# Patient Record
Sex: Female | Born: 1945 | Race: White | Hispanic: No | Marital: Married | Smoking: Former smoker
Health system: Southern US, Community
[De-identification: ages and names within clinical notes are randomized; demographics above are authoritative.]

## PROBLEM LIST (undated history)

## (undated) DIAGNOSIS — D649 Anemia, unspecified: Secondary | ICD-10-CM

## (undated) DIAGNOSIS — E039 Hypothyroidism, unspecified: Secondary | ICD-10-CM

## (undated) DIAGNOSIS — J45909 Unspecified asthma, uncomplicated: Secondary | ICD-10-CM

## (undated) DIAGNOSIS — M1990 Unspecified osteoarthritis, unspecified site: Secondary | ICD-10-CM

## (undated) DIAGNOSIS — I10 Essential (primary) hypertension: Secondary | ICD-10-CM

## (undated) DIAGNOSIS — N183 Chronic kidney disease, stage 3 unspecified: Secondary | ICD-10-CM

## (undated) DIAGNOSIS — E079 Disorder of thyroid, unspecified: Secondary | ICD-10-CM

## (undated) DIAGNOSIS — F419 Anxiety disorder, unspecified: Secondary | ICD-10-CM

## (undated) HISTORY — PX: CHOLECYSTECTOMY: SHX55

## (undated) HISTORY — DX: Unspecified osteoarthritis, unspecified site: M19.90

## (undated) HISTORY — DX: Chronic kidney disease, stage 3 unspecified: N18.30

## (undated) HISTORY — PX: CATARACT EXTRACTION, BILATERAL: SHX1313

## (undated) HISTORY — PX: ABDOMINAL HYSTERECTOMY: SHX81

---

## 2012-03-16 DIAGNOSIS — R109 Unspecified abdominal pain: Secondary | ICD-10-CM

## 2013-11-06 ENCOUNTER — Emergency Department (HOSPITAL_COMMUNITY): Payer: Medicare Other

## 2013-11-06 ENCOUNTER — Encounter (HOSPITAL_COMMUNITY): Payer: Self-pay | Admitting: Emergency Medicine

## 2013-11-06 ENCOUNTER — Emergency Department (HOSPITAL_COMMUNITY)
Admission: EM | Admit: 2013-11-06 | Discharge: 2013-11-06 | Disposition: A | Discharge status: Home / Self Care | Payer: Medicare Other | Attending: Emergency Medicine | Admitting: Emergency Medicine

## 2013-11-06 DIAGNOSIS — Z87891 Personal history of nicotine dependence: Secondary | ICD-10-CM | POA: Insufficient documentation

## 2013-11-06 DIAGNOSIS — J45909 Unspecified asthma, uncomplicated: Secondary | ICD-10-CM | POA: Insufficient documentation

## 2013-11-06 DIAGNOSIS — Z8639 Personal history of other endocrine, nutritional and metabolic disease: Secondary | ICD-10-CM | POA: Insufficient documentation

## 2013-11-06 DIAGNOSIS — Z880 Allergy status to penicillin: Secondary | ICD-10-CM | POA: Insufficient documentation

## 2013-11-06 DIAGNOSIS — Z862 Personal history of diseases of the blood and blood-forming organs and certain disorders involving the immune mechanism: Secondary | ICD-10-CM | POA: Insufficient documentation

## 2013-11-06 DIAGNOSIS — M7918 Myalgia, other site: Secondary | ICD-10-CM

## 2013-11-06 DIAGNOSIS — R109 Unspecified abdominal pain: Secondary | ICD-10-CM | POA: Insufficient documentation

## 2013-11-06 DIAGNOSIS — R0789 Other chest pain: Secondary | ICD-10-CM | POA: Insufficient documentation

## 2013-11-06 HISTORY — DX: Unspecified asthma, uncomplicated: J45.909

## 2013-11-06 HISTORY — DX: Essential (primary) hypertension: I10

## 2013-11-06 HISTORY — DX: Disorder of thyroid, unspecified: E07.9

## 2013-11-06 LAB — CBC WITH DIFFERENTIAL/PLATELET
BASOS ABS: 0 10*3/uL (ref 0.0–0.1)
Basophils Relative: 0 % (ref 0–1)
EOS PCT: 4 % (ref 0–5)
Eosinophils Absolute: 0.3 10*3/uL (ref 0.0–0.7)
HCT: 30.4 % — ABNORMAL LOW (ref 36.0–46.0)
HEMOGLOBIN: 10.1 g/dL — AB (ref 12.0–15.0)
Lymphocytes Relative: 21 % (ref 12–46)
Lymphs Abs: 1.6 10*3/uL (ref 0.7–4.0)
MCH: 30.7 pg (ref 26.0–34.0)
MCHC: 33.2 g/dL (ref 30.0–36.0)
MCV: 92.4 fL (ref 78.0–100.0)
MONOS PCT: 6 % (ref 3–12)
Monocytes Absolute: 0.5 10*3/uL (ref 0.1–1.0)
NEUTROS ABS: 5.4 10*3/uL (ref 1.7–7.7)
Neutrophils Relative %: 69 % (ref 43–77)
Platelets: 352 10*3/uL (ref 150–400)
RBC: 3.29 MIL/uL — ABNORMAL LOW (ref 3.87–5.11)
RDW: 13.1 % (ref 11.5–15.5)
WBC: 7.8 10*3/uL (ref 4.0–10.5)

## 2013-11-06 LAB — URINALYSIS, ROUTINE W REFLEX MICROSCOPIC
BILIRUBIN URINE: NEGATIVE
GLUCOSE, UA: NEGATIVE mg/dL
Hgb urine dipstick: NEGATIVE
Ketones, ur: NEGATIVE mg/dL
LEUKOCYTES UA: NEGATIVE
Nitrite: NEGATIVE
PH: 5.5 (ref 5.0–8.0)
Protein, ur: NEGATIVE mg/dL
Specific Gravity, Urine: 1.01 (ref 1.005–1.030)
Urobilinogen, UA: 0.2 mg/dL (ref 0.0–1.0)

## 2013-11-06 LAB — COMPREHENSIVE METABOLIC PANEL
ALT: 14 U/L (ref 0–35)
AST: 18 U/L (ref 0–37)
Albumin: 3.5 g/dL (ref 3.5–5.2)
Alkaline Phosphatase: 82 U/L (ref 39–117)
BUN: 12 mg/dL (ref 6–23)
CALCIUM: 9.5 mg/dL (ref 8.4–10.5)
CHLORIDE: 99 meq/L (ref 96–112)
CO2: 31 meq/L (ref 19–32)
CREATININE: 1.04 mg/dL (ref 0.50–1.10)
GFR calc Af Amer: 63 mL/min — ABNORMAL LOW (ref >90)
GFR, EST NON AFRICAN AMERICAN: 55 mL/min — AB (ref >90)
Glucose, Bld: 96 mg/dL (ref 70–99)
Potassium: 4.2 mEq/L (ref 3.7–5.3)
Sodium: 140 mEq/L (ref 137–147)
Total Protein: 7.5 g/dL (ref 6.0–8.3)

## 2013-11-06 LAB — TROPONIN I

## 2013-11-06 LAB — LIPASE, BLOOD: Lipase: 42 U/L (ref 11–59)

## 2013-11-06 MED ORDER — FENTANYL CITRATE 0.05 MG/ML IJ SOLN: 50.0000 ug | INTRAMUSCULAR | Status: DC | PRN

## 2013-11-06 MED ORDER — SODIUM CHLORIDE 0.9 % IV SOLN: INTRAVENOUS | Status: DC

## 2013-11-06 MED ORDER — HYDROCODONE-ACETAMINOPHEN 5-325 MG PO TABS: ORAL_TABLET | ORAL | Status: DC

## 2013-11-06 MED ORDER — METHOCARBAMOL 500 MG PO TABS: 500.0000 mg | ORAL_TABLET | Freq: Two times a day (BID) | ORAL | Status: DC

## 2013-11-06 MED ORDER — IOHEXOL 350 MG/ML SOLN
100.0000 mL | Freq: Once | INTRAVENOUS | Status: AC | PRN
  Administered 2013-11-06: 100 mL via INTRAVENOUS

## 2013-11-06 NOTE — ED Notes (Signed)
Pt c/o left side flank pain that radiates to LLQ. Pain began last night. Pt denies dysuria, hematuria. Denies n/v/d.

## 2013-11-06 NOTE — ED Notes (Signed)
Patient transported to X-ray 

## 2013-11-06 NOTE — ED Provider Notes (Signed)
CSN: [id number]     Arrival date & time 11/06/13  0807 History   First MD Initiated Contact with Patient 11/06/13 0831     Chief Complaint  Patient presents with  . Flank Pain      HPI Pt was seen at 0850. Per pt, c/o gradual onset and persistence of constant left lower lateral chest and left flank "pain" that began last night. Pt states the pain wraps around to her lower anterior ribs and upper abd. Describes the pain as "sharp" and "dull," worsens with palpation of the area and movement of her torso. Endorses she had N/V 5 days ago, followed by 2 days of diarrhea. States she took an imodium and saw her PMD 2 days ago for same. States all those symptoms have improved. Denies palpitations, no SOB/cough, no further N/V/D, no fevers, no rash, no dysuria/hematuria, no black or blood in stools or emesis.     Past Medical History  Diagnosis Date  . Asthma   . Thyroid disease    Past Surgical History  Procedure Laterality Date  . Abdominal hysterectomy    . Cholecystectomy      History  Substance Use Topics  . Smoking status: Former Research scientist (life sciences)  . Smokeless tobacco: Not on file  . Alcohol Use: No    Review of Systems ROS: Statement: All systems negative except as marked or noted in the HPI; Constitutional: Negative for fever and chills. ; ; Eyes: Negative for eye pain, redness and discharge. ; ; ENMT: Negative for ear pain, hoarseness, nasal congestion, sinus pressure and sore throat. ; ; Cardiovascular: +lower lateral chest wall pain. Negative for palpitations, diaphoresis, dyspnea and peripheral edema. ; ; Respiratory: Negative for cough, wheezing and stridor. ; ; Gastrointestinal: Negative for nausea, vomiting, diarrhea, abdominal pain, blood in stool, hematemesis, jaundice and rectal bleeding. . ; ; Genitourinary: +left flank pain. Negative for dysuria and hematuria. ; ; Musculoskeletal: Negative for back pain and neck pain. Negative for swelling and trauma.; ; Skin: Negative for pruritus,  rash, abrasions, blisters, bruising and skin lesion.; ; Neuro: Negative for headache, lightheadedness and neck stiffness. Negative for weakness, altered level of consciousness , altered mental status, extremity weakness, paresthesias, involuntary movement, seizure and syncope.      Allergies  Penicillins  Home Medications  No current outpatient prescriptions on file. BP 127/79  Pulse 68  Temp(Src) 97.9 F (36.6 C) (Oral)  Resp 16  SpO2 95% Physical Exam 0855; Physical examination:  Nursing notes reviewed; Vital signs and O2 SAT reviewed;  Constitutional: Well developed, Well nourished, Well hydrated, In no acute distress; Head:  Normocephalic, atraumatic; Eyes: EOMI, PERRL, No scleral icterus; ENMT: Mouth and pharynx normal, Mucous membranes moist; Neck: Supple, Full range of motion, No lymphadenopathy; Cardiovascular: Regular rate and rhythm, No murmur, rub, or gallop; Respiratory: Breath sounds clear & equal bilaterally, No wheezes.  Speaking full sentences with ease, Normal respiratory effort/excursion; Chest: +left lower lateral and posterior chest wall tenderness to palp, no rash, no deformity, no soft tissue crepitus. Movement normal; Abdomen: Soft, Nontender, Nondistended, Normal bowel sounds; Genitourinary: No CVA tenderness; Spine:  No midline CS, TS, LS tenderness. +TTP left lumbar paraspinal muscles. No rash.;; Extremities: Pulses normal, No tenderness, No edema, No calf edema or asymmetry.; Neuro: AA&Ox3, Major CN grossly intact.  Speech clear. No gross focal motor or sensory deficits in extremities.; Skin: Color normal, Warm, Dry.   ED Course  Procedures     EKG Interpretation   Date/Time:  Friday November 06 2013 09:26:57 EDT Ventricular Rate:  64 PR Interval:  154 QRS Duration: 66 QT Interval:  394 QTC Calculation: 406 R Axis:   16 Text Interpretation:  Normal sinus rhythm Low voltage QRS Cannot rule out  Anterior infarct , age undetermined Abnormal ECG No previous ECGs   available Confirmed by [facility]  MD, Nunzio Cory [phone]) on 11/06/2013  10:07:52 AM      MDM  MDM Reviewed: nursing note and vitals Interpretation: labs, ECG, x-ray and CT scan    Results for orders placed during the hospital encounter of 11/06/13  URINALYSIS, ROUTINE W REFLEX MICROSCOPIC      Result Value Ref Range   Color, Urine YELLOW  YELLOW   APPearance CLEAR  CLEAR   Specific Gravity, Urine 1.010  1.005 - 1.030   pH 5.5  5.0 - 8.0   Glucose, UA NEGATIVE  NEGATIVE mg/dL   Hgb urine dipstick NEGATIVE  NEGATIVE   Bilirubin Urine NEGATIVE  NEGATIVE   Ketones, ur NEGATIVE  NEGATIVE mg/dL   Protein, ur NEGATIVE  NEGATIVE mg/dL   Urobilinogen, UA 0.2  0.0 - 1.0 mg/dL   Nitrite NEGATIVE  NEGATIVE   Leukocytes, UA NEGATIVE  NEGATIVE  CBC WITH DIFFERENTIAL      Result Value Ref Range   WBC 7.8  4.0 - 10.5 K/uL   RBC 3.29 (*) 3.87 - 5.11 MIL/uL   Hemoglobin 10.1 (*) 12.0 - 15.0 g/dL   HCT 30.4 (*) 36.0 - 46.0 %   MCV 92.4  78.0 - 100.0 fL   MCH 30.7  26.0 - 34.0 pg   MCHC 33.2  30.0 - 36.0 g/dL   RDW 13.1  11.5 - 15.5 %   Platelets 352  150 - 400 K/uL   Neutrophils Relative % 69  43 - 77 %   Neutro Abs 5.4  1.7 - 7.7 K/uL   Lymphocytes Relative 21  12 - 46 %   Lymphs Abs 1.6  0.7 - 4.0 K/uL   Monocytes Relative 6  3 - 12 %   Monocytes Absolute 0.5  0.1 - 1.0 K/uL   Eosinophils Relative 4  0 - 5 %   Eosinophils Absolute 0.3  0.0 - 0.7 K/uL   Basophils Relative 0  0 - 1 %   Basophils Absolute 0.0  0.0 - 0.1 K/uL  COMPREHENSIVE METABOLIC PANEL      Result Value Ref Range   Sodium 140  137 - 147 mEq/L   Potassium 4.2  3.7 - 5.3 mEq/L   Chloride 99  96 - 112 mEq/L   CO2 31  19 - 32 mEq/L   Glucose, Bld 96  70 - 99 mg/dL   BUN 12  6 - 23 mg/dL   Creatinine, Ser 1.04  0.50 - 1.10 mg/dL   Calcium 9.5  8.4 - 10.5 mg/dL   Total Protein 7.5  6.0 - 8.3 g/dL   Albumin 3.5  3.5 - 5.2 g/dL   AST 18  0 - 37 U/L   ALT 14  0 - 35 U/L   Alkaline Phosphatase 82  39 - 117 U/L    Total Bilirubin <0.2 (*) 0.3 - 1.2 mg/dL   GFR calc non Af Amer 55 (*) >90 mL/min   GFR calc Af Amer 63 (*) >90 mL/min  LIPASE, BLOOD      Result Value Ref Range   Lipase 42  11 - 59 U/L  TROPONIN I      Result Value Ref Range  Troponin I <0.30  <0.30 ng/mL   Dg Ribs Unilateral W/chest Left 11/06/2013   CLINICAL DATA:  Left flank pain  EXAM: LEFT RIBS AND CHEST - 3+ VIEW  COMPARISON:  12/05/2008  FINDINGS: No fracture or other bone lesions are seen involving the ribs. There is no evidence of pneumothorax or pleural effusion. Both lungs are clear. Heart size and mediastinal contours are within normal limits.  IMPRESSION: Negative.   Electronically Signed   By: Franchot Gallo M.D.   On: 11/06/2013 09:49   Ct Angio Chest Pe W/cm &/or Wo Cm 11/06/2013   CLINICAL DATA:  Epigastric pain, left lower chest pain, upper abdominal on left flank pain with pain radiating to left lower quadrant for 1 day, history hypertension, asthma, thyroid disease  EXAM: CT ANGIOGRAPHY CHEST  CT ABDOMEN AND PELVIS WITH CONTRAST  TECHNIQUE: Multidetector CT imaging of the chest was performed using the standard protocol during bolus administration of intravenous contrast. Multiplanar CT image reconstructions and MIPs were obtained to evaluate the vascular anatomy. Multidetector CT imaging of the abdomen and pelvis was performed using the standard protocol during bolus administration of intravenous contrast. Sagittal and coronal MPR images reconstructed from abdominal/pelvic axial data set.  CONTRAST:  146mL OMNIPAQUE IOHEXOL 350 MG/ML SOLN IV. Dilute oral contrast.  COMPARISON:  None  FINDINGS: CTA CHEST FINDINGS  Mild atherosclerotic calcification aorta and coronary arteries.  Aorta normal caliber without aneurysm or dissection.  Pulmonary arteries well opacified and patent.  No evidence of pulmonary embolism.  No thoracic adenopathy.  Scattered atelectasis in both lungs.  Tiny nonspecific right middle lobe nodules.  Additional  tiny nodule at the right major fissure image 53 calcified granuloma lingula image 49.  7 x 5 mm diameter left lower lobe nodule left lower lobe image 50.  No infiltrate, pleural effusion or pneumothorax.  No acute osseous findings.  CT ABDOMEN and PELVIS FINDINGS  Gallbladder and uterus surgically absent.  Liver, spleen, pancreas, kidneys, and adrenal glands normal appearance.  Distal descending and sigmoid colonic diverticulosis without evidence of diverticulitis.  Unremarkable bladder, ureters, and ovaries.  Appendix not visualized.  No mass, adenopathy, free fluid or inflammatory process.  No hernia or acute osseous findings.  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  Review of the MIP images confirms the above findings.  IMPRESSION: No evidence of pulmonary embolism.  No acute intra thoracic abnormalities.  No acute intra-abdominal or intrapelvic abnormalities.  Distal colonic diverticulosis.  Pulmonary nodules, recommendations below.  If the patient is at high risk for bronchogenic carcinoma, follow-up chest CT at 3-36months is recommended. If the patient is at low risk for bronchogenic carcinoma, follow-up chest CT at 6-12 months is recommended. This recommendation follows the consensus statement: Guidelines for Management of Small Pulmonary Nodules Detected on CT Scans: A Statement from the [place] as published in Radiology 2005; 237:395-400.   Electronically Signed   By: Lavonia Dana M.D.   On: 11/06/2013 10:57   Ct Abdomen Pelvis W Contrast 11/06/2013   CLINICAL DATA:  Epigastric pain, left lower chest pain, upper abdominal on left flank pain with pain radiating to left lower quadrant for 1 day, history hypertension, asthma, thyroid disease  EXAM: CT ANGIOGRAPHY CHEST  CT ABDOMEN AND PELVIS WITH CONTRAST  TECHNIQUE: Multidetector CT imaging of the chest was performed using the standard protocol during bolus administration of intravenous contrast. Multiplanar CT image reconstructions and  MIPs were obtained to evaluate the vascular anatomy. Multidetector CT imaging of the abdomen and  pelvis was performed using the standard protocol during bolus administration of intravenous contrast. Sagittal and coronal MPR images reconstructed from abdominal/pelvic axial data set.  CONTRAST:  15mL OMNIPAQUE IOHEXOL 350 MG/ML SOLN IV. Dilute oral contrast.  COMPARISON:  None  FINDINGS: CTA CHEST FINDINGS  Mild atherosclerotic calcification aorta and coronary arteries.  Aorta normal caliber without aneurysm or dissection.  Pulmonary arteries well opacified and patent.  No evidence of pulmonary embolism.  No thoracic adenopathy.  Scattered atelectasis in both lungs.  Tiny nonspecific right middle lobe nodules.  Additional tiny nodule at the right major fissure image 53 calcified granuloma lingula image 49.  7 x 5 mm diameter left lower lobe nodule left lower lobe image 50.  No infiltrate, pleural effusion or pneumothorax.  No acute osseous findings.  CT ABDOMEN and PELVIS FINDINGS  Gallbladder and uterus surgically absent.  Liver, spleen, pancreas, kidneys, and adrenal glands normal appearance.  Distal descending and sigmoid colonic diverticulosis without evidence of diverticulitis.  Unremarkable bladder, ureters, and ovaries.  Appendix not visualized.  No mass, adenopathy, free fluid or inflammatory process.  No hernia or acute osseous findings.  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  Review of the MIP images confirms the above findings.  IMPRESSION: No evidence of pulmonary embolism.  No acute intra thoracic abnormalities.  No acute intra-abdominal or intrapelvic abnormalities.  Distal colonic diverticulosis.  Pulmonary nodules, recommendations below.  If the patient is at high risk for bronchogenic carcinoma, follow-up chest CT at 3-86months is recommended. If the patient is at low risk for bronchogenic carcinoma, follow-up chest CT at 6-12 months is recommended. This recommendation follows the  consensus statement: Guidelines for Management of Small Pulmonary Nodules Detected on CT Scans: A Statement from the [place] as published in Radiology 2005; 237:395-400.   Electronically Signed   By: Lavonia Dana M.D.   On: 11/06/2013 10:57    1230:  Workup reassuring. Doubt PE as cause for symptoms without PE on CT-A chest.  Doubt ACS as cause for symptoms with normal troponin and unchanged EKG from previous after 12+ hours of constant symptoms. Will tx symptomatically for msk pain. Pt states she feels better after pain meds and wants to go home now.  Dx and testing d/w pt and family.  Questions answered.  Verb understanding, agreeable to d/c home with outpt f/u.   Alfonzo Feller, DO 11/08/13 1515

## 2013-11-06 NOTE — Discharge Instructions (Signed)
°Emergency Department Resource Guide °1) Find a Doctor and Pay Out of Pocket °Although you won't have to find out who is covered by your insurance plan, it is a good idea to ask around and get recommendations. You will then need to call the office and see if the doctor you have chosen will accept you as a new patient and what types of options they offer for patients who are self-pay. Some doctors offer discounts or will set up payment plans for their patients who do not have insurance, but you will need to ask so you aren't surprised when you get to your appointment. ° °2) Contact Your Local Health Department °Not all health departments have doctors that can see patients for sick visits, but many do, so it is worth a call to see if yours does. If you don't know where your local health department is, you can check in your phone book. The CDC also has a tool to help you locate your state's health department, and many state websites also have listings of all of their local health departments. ° °3) Find a Walk-in Clinic °If your illness is not likely to be very severe or complicated, you may want to try a walk in clinic. These are popping up all over the country in pharmacies, drugstores, and shopping centers. They're usually staffed by nurse practitioners or physician assistants that have been trained to treat common illnesses and complaints. They're usually fairly quick and inexpensive. However, if you have serious medical issues or chronic medical problems, these are probably not your best option. ° °No Primary Care Doctor: °- Call [facility] at  832-8000 - they can help you locate a primary care doctor that  accepts your insurance, provides certain services, etc. °- Physician Referral Service- 1-800-533-3463 ° °Chronic Pain Problems: °Organization         Address  Phone   Notes  °[facility]  (336) 297-2271 Patients need to be referred by their primary care doctor.  ° °Medication  Assistance: °Organization         Address  Phone   Notes  °[place] [facility] [address] °[place], [place] [address] (336) 641-8030 --Must be a resident of [place] °-- Must have NO insurance coverage whatsoever (no Medicaid/ Medicare, etc.) °-- The pt. MUST have a primary care doctor that directs their care regularly and follows them in the community °  °[facility]  (866) 331-1348   °[employer]  (888) 892-1162   ° °Agencies that provide inexpensive medical care: °Organization         Address  Phone   Notes  °[facility]  (336) 832-8035   °[facility]    (336) 832-7272   °[facility] [address] °[place], [place] [address] (336) 832-4777   °[facility] [address], °[place] (336) 271-4999   °[facility]    (336) 373-0678   °[facility]    (336) 272-1050   °[facility] ° [address], [place] Phone:  (336) 832-4444, Fax:  (336) 832-4440 Hours of Operation:  9 am - 6 pm, M-F.  Also accepts Medicaid/Medicare and self-pay.  °[facility] ° [address], [place] Phone: (336) 832-3150, Fax: (336) 832-3151. Hours of Operation:  8:30 am - 5:30 pm, M-F.  Also accepts Medicaid and self-pay.  °[facility] [place] [address], [place] Phone: (336) 878-6027   °[facility] [address], [place], [place] (336)723-1848, Ext. 123 Mondays & Thursdays: 7-9 AM.  First 15 patients are seen on a first come, first serve basis. °  ° °Medicaid-accepting [place] Providers: ° °Organization         Address  Phone   Notes  °[facility] [address], [place] (336) 641-2100 Also accepts self-pay patients.  °[facility] [address], [place] ° (336) 856-9996   °[facility] [address], [place]  (336) 288-8857   °[facility] [address] [place] [address], [place] (336) 299-7000   °Veita Bland [address], [place]  ° (336) 373-1557 Only accepts [place] [facility] Medicaid patients after they have their name applied to their card.  ° °Self-Pay (no insurance) in [place]: ° °Organization         Address  Phone   Notes  °Sickle Cell Patients, [facility] [address], [place] (336) 832-1970   °[facility] [address], [place] (336) 832-4400   °[facility] ° [address], [place] (336) 992-4800   °[facility]/Dr. Osei-Bonsu ° [address] [place] [address], [place] or [address], [place] (336) 841-8500 Phone number for both [place] and [place] locations is the same.  °[facility] [address], [place] (336) 299-0000   °[facility] [address] [place] [address], [place] or [address] (336) 852-7530 °(336) 878-2260   °[facility] [address], [place] (336) 350-1642, phone; (336) 294-5005, fax Sees patients 1st and 3rd Saturday of every month.  Must not qualify for public or private insurance (i.e. Medicaid, Medicare, [place] Health Choice, Veterans' Benefits) • Household income should be no more than 200% of the poverty level •The clinic cannot treat you if you are pregnant or think you are pregnant • Sexually transmitted diseases are not treated at the clinic.  ° ° °Dental Care: °Organization         Address  Phone  Notes  °[place] [facility] [address], [place] (336) 641-6152 Accepts children up to age 21 who are enrolled in Medicaid or [place] Health Choice; pregnant women with a Medicaid card; and children who have applied for Medicaid or [place] Health Choice, but were declined, whose parents can pay a reduced fee at time of service.  °[place]  [facility] [place]  [address], [place] (336) 641-7733 Accepts children up to age 21 who are enrolled in Medicaid or [place] Health Choice; pregnant women with a Medicaid card; and children who have applied for Medicaid or  Health Choice, but were declined, whose parents can pay a reduced fee at time of service.  °[facility] ° [address], [place] (336) 641-4533 Patients are seen by appointment only. Walk-ins are not accepted. [facility] will see patients 18 years of age and older. °Monday - Tuesday (8am-5pm) °Most Wednesdays (8:30-5pm) °$30 per visit, cash only  °[facility] ° [address], [place] (336) 641-4533 Patients are seen by appointment only. Walk-ins are not accepted. [facility] will see patients 18 years of age and older. °One   Wednesday Evening (Monthly: Volunteer Based).  $30 per visit, cash only  °[facility]  (919) 537-3737 for adults; Children under age 4, call Graduate Pediatric Dentistry at (919) [phone]. Children aged 4-14, please call (919) [phone] to request a pediatric application. ° Dental services are provided in all areas of dental care including fillings, crowns and bridges, complete and partial dentures, implants, gum treatment, root canals, and extractions. Preventive care is also provided. Treatment is provided to both adults and children. °Patients are selected via a lottery and there is often a waiting list. °  °[facility] [address], °[place] ° (336) 763-8833 [email or web address] °  °[facility] [address], [place], [place] (336)723-1848, Ext. 123 Second and Fourth Thursday of each month, opens at 6:30 AM; Clinic ends at 9 AM.  Patients are seen on a first-come first-served basis, and a limited number are seen during each clinic.  ° °[facility] ° [address], [place], [place] (336) 723-7904    Eligibility Requirements °You must have lived in [place], [place], or [place] counties for at least the last three months. °  You cannot be eligible for state or federal sponsored healthcare insurance, including Veterans Administration, Medicaid, or Medicare. °  You generally cannot be eligible for healthcare insurance through your employer.  °  How to apply: °Eligibility screenings are held every Tuesday and Wednesday afternoon from 1:00 pm until 4:00 pm. You do not need an appointment for the interview!  °[facility] [address], [place], [place] 336-631-2330   °[facility]  336-342-8273   °[facility]  336-703-3100   °[facility]  336-570-6415   ° °Behavioral Health Resources in the Community: °Intensive Outpatient Programs °Organization         Address  Phone  Notes  °[facility] [address], [place], [place] 336-878-6098   °[facility] [address], [place], [place] 336-832-9800   °[facility] [address], [place], [place] ° 336-882-2125   °[facility] [address],  °[place], [place] 1-800-853-5163 or 336-641-4981   °Substance Abuse Resources °Organization         Address  Phone  Notes  °[facility]  336-882-2125   °[facility]  336-784-9470   °[facility]  336-285-9073   °[facility]  336-845-3988   °Residential & Outpatient Substance Abuse Program  1-800-659-3381   °Psychological Services °Organization         Address  Phone  Notes  °[facility]  336- 832-9600   °[facility]  336- 378-7881   °[facility] [address], [place] 1-800-853-5163 or 336-641-4981   ° °Mobile Crisis Teams °Organization         Address  Phone  Notes  °[facility], Mobile Crisis Care Unit  1-877-626-1772   °Assertive °[facility] ° [address]  [place], [place] 336-834-9664   °Sharon DeEsch [address] °[place] 336-554-5454   ° °Self-Help/Support Groups °Organization         Address  Phone             Notes  °[facility] - variety of support groups  336- 373-1402 Call for more information  °Narcotics Anonymous (NA), [facility] [address], °[place]   2 meetings at this location  ° °  Residential Treatment Programs °Organization         Address  Phone  Notes  °[facility] [address],    °[place]  1-866-801-8205   °[facility] ° [address], [place], [place] 704-293-8524   °[facility] [address], [place] 336-845-3988 Admissions: 8am-3pm M-F  °[facility] [address]    °[place], [place] 336-841-1104   °[facility] [address], [place], [place] 336-379-7146   °[facility] [address]  °[place], [place] 336-285-9073   °[facility] - Intensive Outpatient [address], [place], [place] 336-852-3033   °[facility] [address]  °[place], [place] 1-877-615-2722 or 336-784-9470   °[facility] [address] [place], [place] 336-227-7417 Accepts Medicaid  °[facility] [address]  °[place] 1-800-659-3381 Substance Abuse/Addiction Treatment  ° °[place] Behavioral Health Resources °Organization         Address  Phone  Notes  °[facility]  (888) 581-9988   °Julie Brannon, PhD [address] [place], [place]   (336) 349-5553 or (336) 951-0000   °[facility]   [address] °[place], [place] (336) 349-4454   °[facility] [address], [place], [place] (336) 342-8316 Insurance/Medicaid/sponsorship through [facility]  °[facility] [address]                                    [place], [place] (336) 342-8316 Therapy/tele-psych/case    °[facility] [address]  ° [place], [place] (336) 349-2233    °Dr. Arfeen  (336) 349-4544   °[facility] [place]  [employer] [place] [facility] 1) [address],  °2) [address], [place] °3)  [address], [place] (336) 349-3220 °(336) 342-7768 ° °(336) 342-8140   °[place] [facility] (336) 342-1394 or (336) 342-3537 (After Hours)    ° ° °Take the prescriptions as directed.  Apply moist heat or ice to the area(s) of discomfort, for 15 minutes at a time, several times per day for the next few days.  Do not fall asleep on a heating or ice pack.  Call your regular medical doctor today to schedule a follow up appointment in the next 2 days.  Return to the Emergency Department immediately if worsening. ° °

## 2013-11-06 NOTE — ED Notes (Signed)
Pt had gastroenteritis Sunday-Wednesday but does not have any symptoms from this now and states that this pain began yesterday.  Denies any GU symptoms

## 2013-11-08 LAB — URINE CULTURE

## 2015-08-17 ENCOUNTER — Encounter (INDEPENDENT_AMBULATORY_CARE_PROVIDER_SITE_OTHER): Payer: Self-pay | Admitting: *Deleted

## 2015-09-15 ENCOUNTER — Encounter (INDEPENDENT_AMBULATORY_CARE_PROVIDER_SITE_OTHER): Payer: Self-pay | Admitting: *Deleted

## 2015-09-15 ENCOUNTER — Ambulatory Visit (INDEPENDENT_AMBULATORY_CARE_PROVIDER_SITE_OTHER): Payer: Medicare Other | Admitting: Internal Medicine

## 2015-09-15 ENCOUNTER — Other Ambulatory Visit (INDEPENDENT_AMBULATORY_CARE_PROVIDER_SITE_OTHER): Payer: Self-pay | Admitting: Internal Medicine

## 2015-09-15 ENCOUNTER — Encounter (INDEPENDENT_AMBULATORY_CARE_PROVIDER_SITE_OTHER): Payer: Self-pay

## 2015-09-15 ENCOUNTER — Encounter (INDEPENDENT_AMBULATORY_CARE_PROVIDER_SITE_OTHER): Payer: Self-pay | Admitting: Internal Medicine

## 2015-09-15 DIAGNOSIS — R1310 Dysphagia, unspecified: Secondary | ICD-10-CM

## 2015-09-15 DIAGNOSIS — E039 Hypothyroidism, unspecified: Secondary | ICD-10-CM | POA: Insufficient documentation

## 2015-09-15 DIAGNOSIS — M1990 Unspecified osteoarthritis, unspecified site: Secondary | ICD-10-CM | POA: Insufficient documentation

## 2015-09-15 DIAGNOSIS — K529 Noninfective gastroenteritis and colitis, unspecified: Secondary | ICD-10-CM | POA: Diagnosis not present

## 2015-09-15 DIAGNOSIS — I10 Essential (primary) hypertension: Secondary | ICD-10-CM | POA: Insufficient documentation

## 2015-09-15 MED ORDER — OMEPRAZOLE 40 MG PO CPDR: 40.0000 mg | DELAYED_RELEASE_CAPSULE | Freq: Every day | ORAL | Status: AC

## 2015-09-15 NOTE — Progress Notes (Addendum)
Subjective:    Patient ID: Sara Solomon, female    DOB: 1945/10/17, 70 y.o.   MRN: [id number]  HPI Referred by Dr Woody Seller. She was admitted to [facility] in December for colitis.  She tells me she did not feel good. She had not eaten anything but sherbet. She saw her sister and ate some party mix. After eating the party mix, she felt her mouth swell and she felt terrible. She went to the ED. She did have nausea and vomiting and diarrhea. Her stools were very loose.  There was no fever associated with her symptoms. She said her B/p dropped and her heart rate went up. She was admitted x 3 days. They think she may have had an allergic reaction to the party mix.  The diarrhea resolved after going home. She tells me she is 100% better. She denies any GI symptoms except for dysphagia.  She has a BM daily. Sometimes she will skip a day or 2 and not have a BM. She has not had any diarrhea since being in hospital. Her appetite is good. No unintentional weight loss.  Her last colonoscopy was 2013 by Dr. Yaakov Guthrie and she reports it was normal. She was told to follow up in 10 year. She does tell me foods are slow to go down. She says she has to throw up the bolus sometimes. Breads are the worse culprit. She has symptoms for about 10 year. She does have occasional have acid reflux.  08/01/2015 Glucose 88, BUN 17, Creatinine 1.09, albumin 4.3, ALP 112, AST 19.8, ALP 112, Total bili 0.4., ALT 18 H and H 9./2 and 28.7 07/29/2015 CT abdomen/pelvis with CM: abdominal pain, Nausea and vomiting: Colitis from the splenic flexure thru the descending colon, may be infection, inflammatory, or less likely ischemic given the moderate long segment involvement. Small amt of free fluid is likely reactive.  06/02/2012 Colonoscopy Screening Dr. Truddie Hidden:  No active bleeding noted. No colonic neoplasms, No colonic or rectal polyps, Sigmoid diverticulosis, Internal hemorrhoids in the rectum. Sharp angulation at recto-sigmoid. Tortuous  and redundant colon.    Review of Systems Past Medical History  Diagnosis Date  . Asthma   . Thyroid disease   . Hypertension   . Arthritis     Past Surgical History  Procedure Laterality Date  . Abdominal hysterectomy    . Cholecystectomy      Allergies  Allergen Reactions  . Penicillins Hives    Current Outpatient Prescriptions on File Prior to Visit  Medication Sig Dispense Refill  . aspirin EC 81 MG tablet Take 81 mg by mouth daily.    Marland Kitchen ibuprofen (ADVIL,MOTRIN) 200 MG tablet Take 800 mg by mouth daily as needed for headache or moderate pain.    Marland Kitchen levothyroxine (SYNTHROID, LEVOTHROID) 75 MCG tablet Take 88 mcg by mouth daily before breakfast.     . lisinopril-hydrochlorothiazide (PRINZIDE,ZESTORETIC) 20-25 MG per tablet Take 1 tablet by mouth daily.     No current facility-administered medications on file prior to visit.        Objective:   Physical Exam Blood pressure 128/60, pulse 68, temperature 98 F (36.7 C), height 5\' 7"  (1.702 m), weight 182 lb 8 oz (82.781 kg). Alert and oriented. Skin warm and dry. Oral mucosa is moist.   . Sclera anicteric, conjunctivae is pink. Thyroid not enlarged. No cervical lymphadenopathy. Lungs clear. Heart regular rate and rhythm.  Abdomen is soft. Bowel sounds are positive. No hepatomegaly. No abdominal masses felt. No tenderness.  No edema to lower extremities. Patient is alert and oriented     Assessment & Plan:  Dysphagia. Esophageal stricture needs to be ruled out. EGD/ED. The risks and benefits such as perforation, bleeding, and infection were reviewed with the patient and is agreeable. Rx for Omeprazole 40mg  sent her pharmacy.

## 2015-09-15 NOTE — Patient Instructions (Addendum)
EGD/ED. The risks and benefits such as perforation, bleeding, and infection were reviewed with the patient and is agreeable. 

## 2015-09-28 ENCOUNTER — Telehealth (INDEPENDENT_AMBULATORY_CARE_PROVIDER_SITE_OTHER): Payer: Self-pay | Admitting: *Deleted

## 2015-09-28 NOTE — Telephone Encounter (Signed)
noted 

## 2015-09-28 NOTE — Telephone Encounter (Signed)
FYI: procedure -- patient called to cancel EGD/ED sch'd 10/13/15, states Priolsec you gave is working and she hasn't had any problems since she's been taking it

## 2015-10-13 ENCOUNTER — Encounter (HOSPITAL_COMMUNITY): Payer: Self-pay

## 2015-10-13 ENCOUNTER — Ambulatory Visit (HOSPITAL_COMMUNITY): Admit: 2015-10-13 | Payer: Medicare Other | Admitting: Internal Medicine

## 2015-10-13 SURGERY — EGD (ESOPHAGOGASTRODUODENOSCOPY)
Anesthesia: Moderate Sedation

## 2016-04-20 ENCOUNTER — Encounter (INDEPENDENT_AMBULATORY_CARE_PROVIDER_SITE_OTHER): Payer: Self-pay

## 2017-03-26 ENCOUNTER — Other Ambulatory Visit: Payer: Self-pay | Admitting: General Surgery

## 2017-03-26 ENCOUNTER — Encounter: Payer: Self-pay | Admitting: General Surgery

## 2017-03-26 ENCOUNTER — Ambulatory Visit (INDEPENDENT_AMBULATORY_CARE_PROVIDER_SITE_OTHER): Payer: Medicare Other | Admitting: General Surgery

## 2017-03-26 VITALS — BP 142/69 | HR 69 | Temp 98.4°F | Resp 18 | Ht 66.0 in | Wt 182.0 lb

## 2017-03-26 DIAGNOSIS — C50211 Malignant neoplasm of upper-inner quadrant of right female breast: Secondary | ICD-10-CM | POA: Diagnosis not present

## 2017-03-26 DIAGNOSIS — IMO0002 Reserved for concepts with insufficient information to code with codable children: Secondary | ICD-10-CM

## 2017-03-26 DIAGNOSIS — R229 Localized swelling, mass and lump, unspecified: Principal | ICD-10-CM

## 2017-03-26 DIAGNOSIS — Z853 Personal history of malignant neoplasm of breast: Secondary | ICD-10-CM

## 2017-03-26 NOTE — Progress Notes (Signed)
Sara Solomon; [id number]; 04/07/1946   HPI Patient is a 71 year old white female who was referred to my care by Dr. Woody Seller for evaluation and treatment of a right breast cancer. This was found on routine mammography. 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 Patient had a negative screening mammogram 1 year ago. She does not feel lump. She has no breast pain. She denies any immediate family history of breast cancer. She has had no nipple discharge. Past Medical History:  Diagnosis Date  . Arthritis   . Asthma   . Hypertension   . Thyroid disease     Past Surgical History:  Procedure Laterality Date  . ABDOMINAL HYSTERECTOMY    . CHOLECYSTECTOMY      No family history on file.  Current Outpatient Prescriptions on File Prior to Visit  Medication Sig Dispense Refill  . aspirin EC 81 MG tablet Take 81 mg by mouth daily.    . calcium-vitamin D (OSCAL WITH D) 500-200 MG-UNIT tablet Take 1 tablet by mouth.    Marland Kitchen ibuprofen (ADVIL,MOTRIN) 200 MG tablet Take 800 mg by mouth daily as needed for headache or moderate pain.    Marland Kitchen levothyroxine (SYNTHROID, LEVOTHROID) 75 MCG tablet Take 88 mcg by mouth daily before breakfast.     . lisinopril-hydrochlorothiazide (PRINZIDE,ZESTORETIC) 20-25 MG per tablet Take 1 tablet by mouth daily.    . Omega-3 Fatty Acids (FISH OIL) 1000 MG CAPS Take by mouth.    Marland Kitchen omeprazole (PRILOSEC) 40 MG capsule Take 1 capsule (40 mg total) by mouth daily. 90 capsule 3  . Red Yeast Rice 600 MG TABS Take 1,200 mg by mouth every morning.     No current facility-administered medications on file prior to visit.     Allergies  Allergen Reactions  . Penicillins Hives    History  Alcohol Use No    History  Smoking Status  . Former Smoker  Smokeless Tobacco  . Never Used    Comment: quit smoking in 1998    Review of Systems  Constitutional: Negative.   HENT: Negative.   Eyes: Negative.   Respiratory: Negative.   Cardiovascular: Negative.    Gastrointestinal: Negative.   Genitourinary: Positive for frequency.  Musculoskeletal: Positive for back pain and joint pain.  Skin: Negative.   Neurological: Negative.   Endo/Heme/Allergies: Negative.   Psychiatric/Behavioral: Negative.     Objective   Vitals:   03/26/17 1141  BP: (!) 142/69  Pulse: 69  Resp: 18  Temp: 98.4 F (36.9 C)    Physical Exam  Constitutional: She is oriented to person, place, and time and well-developed, well-nourished, and in no distress.  HENT:  Head: Normocephalic and atraumatic.  Neck: Normal range of motion. Neck supple.  Cardiovascular: Normal rate, regular rhythm and normal heart sounds.   No murmur heard. Pulmonary/Chest: Effort normal and breath sounds normal. She has no wheezes. She has no rales.  Lymphadenopathy:    She has no cervical adenopathy.  Neurological: She is alert and oriented to person, place, and time.  Skin: Skin is warm and dry.  Vitals reviewed.  Breast examination reveals no dominant mass, nipple discharge, dimpling in either breast. Axillas are negative palpable nodes. Ultrasound and pathology reports reviewed Assessment  Invasive ductal carcinoma, right breast Plan   Patient is scheduled for right partial mastectomy with sentinel lymph node biopsy on 04/03/2017. She realizes that she will need postoperative radiation therapy after the procedure. She would like to keep her breast. The risks and benefits of  the procedure including bleeding, infection, the possibility of a complete axillary dissection, and the possibility of incomplete margins were fully explained to the patient, who gave informed consent.

## 2017-03-26 NOTE — Patient Instructions (Signed)
Partial Mastectomy With or Without Axillary Lymph Node Removal Partial mastectomy with or without axillary lymph node removal is a surgery to remove breast cancer. It is a type of breast-conserving surgery. This means that the cancerous tissue is removed but the breast remains intact. During this procedure, the tumor and a small rim of healthy tissue surrounding it will be removed. Lymph nodes under your arm may also be removed and tested to find out if the cancer has spread. Let your health care provider know about:  Any allergies you have.  All medicines you are taking, including vitamins, herbs, eye drops, creams, and over-the-counter medicines.  Previous problems you or members of your family have had with the use of anesthetics.  Any blood disorders you have.  Any surgeries you have had.  Any medical conditions you have. What are the risks? Generally, this is a safe procedure. However, problems may occur, including:  A change in the way your breast looks and feels.  Breast pain.  Infection.  Bleeding.  Pain, swelling, weakness, or numbness in the arm on the side of your surgery.  What happens before the procedure?  Ask your health care provider about: ? Changing or stopping your regular medicines. This is especially important if you are taking diabetes medicines or blood thinners. ? Taking medicines such as aspirin and ibuprofen. These medicines can thin your blood. Do not take these medicines before your procedure if your health care provider instructs you not to.  Follow your health care provider's instructions about eating or drinking restrictions.  You may be checked for extra fluid around your lymph nodes (lymphedema). What happens during the procedure?  An IV tube will be inserted into one of your veins.  You will be given a medicine that makes you fall asleep (general anesthetic).  Your surgeon may Sarabi Sockwell your breast to indicate the location of your tumor and to  plan the incision.  Your breast will be cleaned with a germ-killing solution (antiseptic).  Your surgeon will make an incision over the area of your breast where the tumor is located. This will usually be a curved incision that follows the normal shape of your breast.  The tumor will be removed along with a portion of the tissue that surrounds it.  If the tumor is close to the muscles over your chest, some muscle tissue may also be removed.  The incision may extend to the lymph nodes under your arm, or a second incision may be made under your arm.  Lymph nodes under your arm may be removed.  You may have a drainage tube inserted into your incision to collect fluid that builds up after surgery. This tube will be connected to a suction bulb.  Your incision or incisions will be closed with stitches (sutures).  A bandage (dressing) will be placed over your breast and under your arm. The procedure may vary among health care providers and hospitals. What happens after the procedure?  Your blood pressure, heart rate, breathing rate, and blood oxygen level will be monitored often until the medicines you were given have worn off.  You will be given pain medicine as needed.  You will be encouraged to get up and walk as soon as you can.  Your IV tube will be removed when you are able to eat and drink.  Your drain may be removed before you go home from the hospital, or you may be sent home with your drain and suction bulb. This information is   Your IV tube will be removed when you are able to eat and drink.   Your drain may be removed before you go home from the hospital, or you may be sent home with your drain and suction bulb.  This information is not intended to replace advice given to you by your health care provider. Make sure you discuss any questions you have with your health care provider.  Document Released: 12/14/2014 Document Revised: 04/05/2016 Document Reviewed: 04/14/2014  [employer] Patient Education  2018 [employer]

## 2017-03-29 NOTE — Patient Instructions (Signed)
Sara Solomon  03/29/2017     @PREFPERIOPPHARMACY @   Your procedure is scheduled on  04/03/2017   Report to [facility] at  16 A.M.  Call this number if you have problems the morning of surgery:  [phone]   Remember:  Do not eat food or drink liquids after midnight.  Take these medicines the morning of surgery with A SIP OF WATER  Levothyroxine, lisinopril, antivert, prilosec.   Do not wear jewelry, make-up or nail polish.  Do not wear lotions, powders, or perfumes, or deoderant.  Do not shave 48 hours prior to surgery.  Men may shave face and neck.  Do not bring valuables to the hospital.  [facility] is not responsible for any belongings or valuables.  Contacts, dentures or bridgework may not be worn into surgery.  Leave your suitcase in the car.  After surgery it may be brought to your room.  For patients admitted to the hospital, discharge time will be determined by your treatment team.  Patients discharged the day of surgery will not be allowed to drive home.   Name and phone number of your driver:   family Special instructions:  None  Please read over the following fact sheets that you were given. Anesthesia Post-op Instructions and Care and Recovery After Surgery       Sentinel Lymph Node Biopsy Sentinel lymph node biopsy is a procedure to identify, remove, and examine one or more lymph nodes for cancer. Lymph nodes are collections of tissue that help filter infections, cancer cells, and other waste substances from the bloodstream. Certain types of cancer can spread to nearby lymph nodes. The cancer usually spreads to one lymph node first, and then to others. The first lymph node that the cancer could spread to is called the sentinel lymph node. In some cases, there may be more than one sentinel lymph node. You may have this procedure to determine whether your cancer has spread and to help your health care provider plan treatment for you. If no  cancer is found in the sentinel lymph node, it is very unlikely that the cancer has spread to any of the other lymph nodes. If cancer is found in the sentinel lymph node, your surgeon may remove additional lymph nodes for examination. Tell a health care provider about:  Any allergies you have, including any history of problems with contrast dye.  All medicines you are taking, including vitamins, herbs, eye drops, creams, and over-the-counter medicines.  Any problems you or family members have had with anesthetic medicines.  Any blood disorders you have.  Any surgeries you have had.  Any medical conditions you have. What are the risks? Generally, sentinel lymph node biopsy is a safe procedure. However, problems can occur and include:  Infection.  Bleeding.  Allergic reaction to the dye used for the procedure.  Staining of the skin where the dye is injected.  Damaged lymph vessels, causing a buildup of fluid (lymphedema).  Pain or bruising at the biopsy site.  What happens before the procedure?  If you smoke, stop smoking at least 2 weeks before the procedure. This will improve your health after the procedure and reduce your risk of getting an infection.  You may have blood tests to make sure your blood clots normally.  Ask your health care provider about: ? Changing or stopping your regular medicines. This is especially important if you are taking diabetes medicines or  blood thinners. ? Taking medicines such as aspirin and ibuprofen. These medicines can thin your blood. Do not take these medicines before your procedure if your health care provider asks you not to.  Do not eat or drink anything after midnight on the night before the procedure or as directed by your health care provider.  Plan to have someone take you home after the procedure. What happens during the procedure?  You will be given a medicine that makes you fall asleep (general anesthetic) or medicine that  numbs the surgical area (local anesthetic).  Blue dye or a radioactive substance or both will be injected around the tumor. ? The blue dye will reach your lymph node quickly. It may be given just before surgery. ? The radioactive substance will take longer to reach your lymph nodes, so it may be given before you go into the operating area.  Both the dye and the radioactive substance will follow the same path that a spreading cancer would be likely to follow.  If a radioactive substance is injected, a scanner will show where the substance has spread to help identify the sentinel lymph node.  The surgeon will make a small incision. If blue dye is injected, your surgeon will look for any lymph nodes that have picked up the dye.  Sentinel lymph nodes will be removed and sent to a lab for examination.  If no cancer is found, no other lymph nodes will be removed. This means it is unlikely the cancer has spread to other lymph nodes.  If cancer is found, the surgeon will remove other lymph nodes for examination. This may happen during the same procedure or at a later time.  The incision will be closed with stitches or metal clips.  Small adhesive bandages may be used to keep the skin edges close together.  A small dressing may be taped over the incision area. What happens after the procedure?  You will go to a recovery room.  Your urine may be blue for the next 24 hours. This is normal. It is caused by the dye used during the procedure.  Your skin at the injection site may be blue for up to 8 weeks.  You may feel numbness, tingling, or pain near your incision.  You may have swelling or bruising near your incision. This information is not intended to replace advice given to you by your health care provider. Make sure you discuss any questions you have with your health care provider. Document Released: 10/22/2011 Document Revised: 04/12/2016 Document Reviewed: 09/18/2013 Elsevier  Interactive Patient Education  2018 [place] Lymph Node Biopsy, Care After Refer to this sheet in the next few weeks. These instructions provide you with information on caring for yourself after your procedure. Your health care provider may also give you more specific instructions. Your treatment has been planned according to current medical practices, but problems sometimes occur. Call your health care provider if you have any problems or questions after your procedure. What can I expect after the procedure? After your procedure, it is typical to have the following:  Your urine may be blue for the next 24 hours. This is normal. It is caused by the dye used during the procedure.  Your skin at the injection site may be blue for up to 8 weeks.  You may feel numbness, tingling, or pain near your surgical incision.  You may have swelling or bruising near your incision.  Follow these instructions at home:  Avoid  vigorous exercise. Ask your health care provider when you can return to your normal activities.  You may shower 24 hours after your procedure. It is okay to get your incision wet. Pat the area dry with a clean towel. Do not rub the incision because this may cause bleeding.  Women who are given a surgical bra should wear it for the next 48 hours. The bra may be removed to shower.  There are many different ways to close and cover an incision, including stitches, skin glue, and adhesive strips. Follow your health care provider's instructions on: ? Incision care. ? Bandage (dressing) changes and removal. ? Incision closure removal.  Take medicines only as directed by your health care provider.  You may resume your regular diet.  Do not have your blood pressure taken in the arm on the side of the biopsy until your health care provider says it is okay.  Keep all follow-up visits as directed by your health care provider. This is important. Contact a health care provider  if:  Your pain medicine is not helping.  You have nausea and vomiting.  You have any new swelling, bruising, or redness.  You have chills or a fever. Get help right away if:  You have pain that is getting worse, and your medicine is not helping.  You have redness, swelling, or tenderness that is getting worse.  You have bleeding or drainage from your incision site.  You have vomiting that will not stop.  You have chest pain or trouble breathing. This information is not intended to replace advice given to you by your health care provider. Make sure you discuss any questions you have with your health care provider. Document Released: 03/13/2004 Document Revised: 01/05/2016 Document Reviewed: 09/18/2013 Elsevier Interactive Patient Education  2018 [place]. Partial Mastectomy With or Without Axillary Lymph Node Removal, Care After Refer to this sheet in the next few weeks. These instructions provide you with information about caring for yourself after your procedure. Your health care provider may also give you more specific instructions. Your treatment has been planned according to current medical practices, but problems sometimes occur. Call your health care provider if you have any problems or questions after your procedure. What can I expect after the procedure? After your procedure, it is common to have:  Breast swelling.  Breast tenderness.  Stiffness in your arm or shoulder.  A change in the shape and feel of your breast.  Follow these instructions at home: Bathing  Take sponge baths until your health care provider says that you can start showering or bathing.  Do not take baths, swim, or use a hot tub until your health care provider approves. Incision care  There are many different ways to close and cover an incision, including stitches, skin glue, and adhesive strips. Follow your health care provider's instructions about: ? Incision care. ? Bandage (dressing)  changes and removal. ? Incision closure removal.  Check your incision area every day for signs of infection. Watch for: ? Redness, swelling, or pain. ? Fluid, blood, or pus.  If you were sent home with a surgical drain in place, follow your health care provider's instructions for emptying it. Activity  Return to your normal activities as directed by your health care provider.  Avoid strenuous exercise.  Be careful to avoid any activities that could cause an injury to your arm on the side of your surgery.  Do not lift anything that is heavier than 10 lb (4.5 kg).  Avoid lifting with the arm that is on the side of your surgery.  Do not carry heavy objects on your shoulder.  After your drain is removed, you should perform exercises to keep your arm from getting stiff and swollen. Talk with your health care provider about which exercises are safe for you. General instructions  Take medicines only as directed by your health care provider.  Keep your dressing clean and dry.  You may eat what you usually do.  Wear a supportive bra as directed by your health care provider.  Keep your arm elevated when at rest.  Do not wear tight jewelry on your arm, wrist, or fingers on the side of your surgery.  Get checked for extra fluid around your lymph nodes (lymphedema) as often as told by your health care provider.  If you had any lymph nodes removed during your procedure, be sure to tell all of your health care providers. This is important information to share before you are involved in certain procedures, such as giving blood or having your blood pressure taken. Contact a health care provider if:  You have a fever.  Your pain medicine is not working.  Your swelling, weakness, or numbness in your arm has not improved after a few weeks.  You have new swelling in your breast or arm.  You have redness, swelling, or pain in your incision area.  You have fluid, blood, or pus coming from  your incision. Get help right away if:  You have very bad pain in your breast or arm.  You have chest pain.  You have difficulty breathing. This information is not intended to replace advice given to you by your health care provider. Make sure you discuss any questions you have with your health care provider. Document Released: 03/13/2004 Document Revised: 04/05/2016 Document Reviewed: 04/14/2014 Elsevier Interactive Patient Education  2018 [place] Anesthesia, Adult General anesthesia is the use of medicines to make a person "go to sleep" (be unconscious) for a medical procedure. General anesthesia is often recommended when a procedure:  Is long.  Requires you to be still or in an unusual position.  Is major and can cause you to lose blood.  Is impossible to do without general anesthesia.  The medicines used for general anesthesia are called general anesthetics. In addition to making you sleep, the medicines:  Prevent pain.  Control your blood pressure.  Relax your muscles.  Tell a health care provider about:  Any allergies you have.  All medicines you are taking, including vitamins, herbs, eye drops, creams, and over-the-counter medicines.  Any problems you or family members have had with anesthetic medicines.  Types of anesthetics you have had in the past.  Any bleeding disorders you have.  Any surgeries you have had.  Any medical conditions you have.  Any history of heart or lung conditions, such as heart failure, sleep apnea, or chronic obstructive pulmonary disease (COPD).  Whether you are pregnant or may be pregnant.  Whether you use tobacco, alcohol, marijuana, or street drugs.  Any history of Armed forces logistics/support/administrative officer.  Any history of depression or anxiety. What are the risks? Generally, this is a safe procedure. However, problems may occur, including:  Allergic reaction to anesthetics.  Lung and heart problems.  Inhaling food or liquids  from your stomach into your lungs (aspiration).  Injury to nerves.  Waking up during your procedure and being unable to move (rare).  Extreme agitation or a state of mental confusion (  delirium) when you wake up from the anesthetic.  Air in the bloodstream, which can lead to stroke.  These problems are more likely to develop if you are having a major surgery or if you have an advanced medical condition. You can prevent some of these complications by answering all of your health care provider's questions thoroughly and by following all pre-procedure instructions. General anesthesia can cause side effects, including:  Nausea or vomiting  A sore throat from the breathing tube.  Feeling cold or shivery.  Feeling tired, washed out, or achy.  Sleepiness or drowsiness.  Confusion or agitation.  What happens before the procedure? Staying hydrated Follow instructions from your health care provider about hydration, which may include:  Up to 2 hours before the procedure - you may continue to drink clear liquids, such as water, clear fruit juice, black coffee, and plain tea.  Eating and drinking restrictions Follow instructions from your health care provider about eating and drinking, which may include:  8 hours before the procedure - stop eating heavy meals or foods such as meat, fried foods, or fatty foods.  6 hours before the procedure - stop eating light meals or foods, such as toast or cereal.  6 hours before the procedure - stop drinking milk or drinks that contain milk.  2 hours before the procedure - stop drinking clear liquids.  Medicines  Ask your health care provider about: ? Changing or stopping your regular medicines. This is especially important if you are taking diabetes medicines or blood thinners. ? Taking medicines such as aspirin and ibuprofen. These medicines can thin your blood. Do not take these medicines before your procedure if your health care provider  instructs you not to. ? Taking new dietary supplements or medicines. Do not take these during the week before your procedure unless your health care provider approves them.  If you are told to take a medicine or to continue taking a medicine on the day of the procedure, take the medicine with sips of water. General instructions   Ask if you will be going home the same day, the following day, or after a longer hospital stay. ? Plan to have someone take you home. ? Plan to have someone stay with you for the first 24 hours after you leave the hospital or clinic.  For 3-6 weeks before the procedure, try not to use any tobacco products, such as cigarettes, chewing tobacco, and e-cigarettes.  You may brush your teeth on the morning of the procedure, but make sure to spit out the toothpaste. What happens during the procedure?  You will be given anesthetics through a mask and through an IV tube in one of your veins.  You may receive medicine to help you relax (sedative).  As soon as you are asleep, a breathing tube may be used to help you breathe.  An anesthesia specialist will stay with you throughout the procedure. He or she will help keep you comfortable and safe by continuing to give you medicines and adjusting the amount of medicine that you get. He or she will also watch your blood pressure, pulse, and oxygen levels to make sure that the anesthetics do not cause any problems.  If a breathing tube was used to help you breathe, it will be removed before you wake up. The procedure may vary among health care providers and hospitals. What happens after the procedure?  You will wake up, often slowly, after the procedure is complete, usually in a recovery  area.  Your blood pressure, heart rate, breathing rate, and blood oxygen level will be monitored until the medicines you were given have worn off.  You may be given medicine to help you calm down if you feel anxious or agitated.  If you  will be going home the same day, your health care provider may check to make sure you can stand, drink, and urinate.  Your health care providers will treat your pain and side effects before you go home.  Do not drive for 24 hours if you received a sedative.  You may: ? Feel nauseous and vomit. ? Have a sore throat. ? Have mental slowness. ? Feel cold or shivery. ? Feel sleepy. ? Feel tired. ? Feel sore or achy, even in parts of your body where you did not have surgery. This information is not intended to replace advice given to you by your health care provider. Make sure you discuss any questions you have with your health care provider. Document Released: 11/06/2007 Document Revised: 01/10/2016 Document Reviewed: 07/14/2015 Elsevier Interactive Patient Education  2018 [place] Anesthesia, Adult, Care After These instructions provide you with information about caring for yourself after your procedure. Your health care provider may also give you more specific instructions. Your treatment has been planned according to current medical practices, but problems sometimes occur. Call your health care provider if you have any problems or questions after your procedure. What can I expect after the procedure? After the procedure, it is common to have:  Vomiting.  A sore throat.  Mental slowness.  It is common to feel:  Nauseous.  Cold or shivery.  Sleepy.  Tired.  Sore or achy, even in parts of your body where you did not have surgery.  Follow these instructions at home: For at least 24 hours after the procedure:  Do not: ? Participate in activities where you could fall or become injured. ? Drive. ? Use heavy machinery. ? Drink alcohol. ? Take sleeping pills or medicines that cause drowsiness. ? Make important decisions or sign legal documents. ? Take care of children on your own.  Rest. Eating and drinking  If you vomit, drink water, juice, or soup when you  can drink without vomiting.  Drink enough fluid to keep your urine clear or pale yellow.  Make sure you have little or no nausea before eating solid foods.  Follow the diet recommended by your health care provider. General instructions  Have a responsible adult stay with you until you are awake and alert.  Return to your normal activities as told by your health care provider. Ask your health care provider what activities are safe for you.  Take over-the-counter and prescription medicines only as told by your health care provider.  If you smoke, do not smoke without supervision.  Keep all follow-up visits as told by your health care provider. This is important. Contact a health care provider if:  You continue to have nausea or vomiting at home, and medicines are not helpful.  You cannot drink fluids or start eating again.  You cannot urinate after 8-12 hours.  You develop a skin rash.  You have fever.  You have increasing redness at the site of your procedure. Get help right away if:  You have difficulty breathing.  You have chest pain.  You have unexpected bleeding.  You feel that you are having a life-threatening or urgent problem. This information is not intended to replace advice given to you by your  health care provider. Make sure you discuss any questions you have with your health care provider. Document Released: 11/05/2000 Document Revised: 01/02/2016 Document Reviewed: 07/14/2015 Elsevier Interactive Patient Education  [employer].

## 2017-03-29 NOTE — H&P (Signed)
Sara Solomon; [id number]; [birth year]   HPI Patient is a 71 year old white female who was referred to my care by Dr. Woody Seller for evaluation and treatment of a right breast cancer. This was found on routine mammography. 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 Patient had a negative screening mammogram 1 year ago. She does not feel lump. She has no breast pain. She denies any immediate family history of breast cancer. She has had no nipple discharge. Past Medical History:  Diagnosis Date  . Arthritis   . Asthma   . Hypertension   . Thyroid disease     Past Surgical History:  Procedure Laterality Date  . ABDOMINAL HYSTERECTOMY    . CHOLECYSTECTOMY      No family history on file.  Current Outpatient Prescriptions on File Prior to Visit  Medication Sig Dispense Refill  . aspirin EC 81 MG tablet Take 81 mg by mouth daily.    . calcium-vitamin D (OSCAL WITH D) 500-200 MG-UNIT tablet Take 1 tablet by mouth.    Marland Kitchen ibuprofen (ADVIL,MOTRIN) 200 MG tablet Take 800 mg by mouth daily as needed for headache or moderate pain.    Marland Kitchen levothyroxine (SYNTHROID, LEVOTHROID) 75 MCG tablet Take 88 mcg by mouth daily before breakfast.     . lisinopril-hydrochlorothiazide (PRINZIDE,ZESTORETIC) 20-25 MG per tablet Take 1 tablet by mouth daily.    . Omega-3 Fatty Acids (FISH OIL) 1000 MG CAPS Take by mouth.    Marland Kitchen omeprazole (PRILOSEC) 40 MG capsule Take 1 capsule (40 mg total) by mouth daily. 90 capsule 3  . Red Yeast Rice 600 MG TABS Take 1,200 mg by mouth every morning.     No current facility-administered medications on file prior to visit.     Allergies  Allergen Reactions  . Penicillins Hives    History  Alcohol Use No    History  Smoking Status  . Former Smoker  Smokeless Tobacco  . Never Used    Comment: quit smoking in 1998    Review of Systems  Constitutional: Negative.   HENT: Negative.   Eyes: Negative.   Respiratory: Negative.   Cardiovascular: Negative.    Gastrointestinal: Negative.   Genitourinary: Positive for frequency.  Musculoskeletal: Positive for back pain and joint pain.  Skin: Negative.   Neurological: Negative.   Endo/Heme/Allergies: Negative.   Psychiatric/Behavioral: Negative.     Objective   Vitals:   03/26/17 1141  BP: (!) 142/69  Pulse: 69  Resp: 18  Temp: 98.4 F (36.9 C)    Physical Exam  Constitutional: She is oriented to person, place, and time and well-developed, well-nourished, and in no distress.  HENT:  Head: Normocephalic and atraumatic.  Neck: Normal range of motion. Neck supple.  Cardiovascular: Normal rate, regular rhythm and normal heart sounds.   No murmur heard. Pulmonary/Chest: Effort normal and breath sounds normal. She has no wheezes. She has no rales.  Lymphadenopathy:    She has no cervical adenopathy.  Neurological: She is alert and oriented to person, place, and time.  Skin: Skin is warm and dry.  Vitals reviewed.  Breast examination reveals no dominant mass, nipple discharge, dimpling in either breast. Axillas are negative palpable nodes. Ultrasound and pathology reports reviewed Assessment  Invasive ductal carcinoma, right breast Plan   Patient is scheduled for right partial mastectomy with sentinel lymph node biopsy on 04/03/2017. She realizes that she will need postoperative radiation therapy after the procedure. She would like to keep her breast. The risks and benefits of  the procedure including bleeding, infection, the possibility of a complete axillary dissection, and the possibility of incomplete margins were fully explained to the patient, who gave informed consent.

## 2017-04-01 ENCOUNTER — Other Ambulatory Visit: Payer: Self-pay

## 2017-04-01 ENCOUNTER — Encounter (HOSPITAL_COMMUNITY): Payer: Self-pay

## 2017-04-01 ENCOUNTER — Ambulatory Visit (HOSPITAL_COMMUNITY)
Admission: RE | Admit: 2017-04-01 | Discharge: 2017-04-01 | Disposition: A | Discharge status: Home / Self Care | Payer: Medicare Other | Source: Ambulatory Visit | Attending: General Surgery | Admitting: General Surgery

## 2017-04-01 ENCOUNTER — Encounter (HOSPITAL_COMMUNITY)
Admission: RE | Admit: 2017-04-01 | Discharge: 2017-04-01 | Disposition: A | Discharge status: Home / Self Care | Payer: Medicare Other | Source: Ambulatory Visit | Attending: General Surgery | Admitting: General Surgery

## 2017-04-01 DIAGNOSIS — Z01818 Encounter for other preprocedural examination: Secondary | ICD-10-CM | POA: Diagnosis not present

## 2017-04-01 DIAGNOSIS — R918 Other nonspecific abnormal finding of lung field: Secondary | ICD-10-CM | POA: Diagnosis not present

## 2017-04-01 DIAGNOSIS — C50911 Malignant neoplasm of unspecified site of right female breast: Secondary | ICD-10-CM | POA: Insufficient documentation

## 2017-04-01 HISTORY — DX: Anemia, unspecified: D64.9

## 2017-04-01 HISTORY — DX: Hypothyroidism, unspecified: E03.9

## 2017-04-01 HISTORY — DX: Anxiety disorder, unspecified: F41.9

## 2017-04-02 LAB — CBC WITH DIFFERENTIAL/PLATELET
BASOS PCT: 0 %
Basophils Absolute: 0 10*3/uL (ref 0.0–0.1)
Eosinophils Absolute: 0.5 10*3/uL (ref 0.0–0.7)
Eosinophils Relative: 5 %
HEMATOCRIT: 33.8 % — AB (ref 36.0–46.0)
HEMOGLOBIN: 10.8 g/dL — AB (ref 12.0–15.0)
LYMPHS ABS: 1.8 10*3/uL (ref 0.7–4.0)
LYMPHS PCT: 18 %
MCH: 29.3 pg (ref 26.0–34.0)
MCHC: 32 g/dL (ref 30.0–36.0)
MCV: 91.6 fL (ref 78.0–100.0)
MONO ABS: 0.5 10*3/uL (ref 0.1–1.0)
MONOS PCT: 5 %
NEUTROS ABS: 7.3 10*3/uL (ref 1.7–7.7)
NEUTROS PCT: 72 %
Platelets: 414 10*3/uL — ABNORMAL HIGH (ref 150–400)
RBC: 3.69 MIL/uL — ABNORMAL LOW (ref 3.87–5.11)
RDW: 13.7 % (ref 11.5–15.5)
WBC: 10.1 10*3/uL (ref 4.0–10.5)

## 2017-04-02 LAB — BASIC METABOLIC PANEL
Anion gap: 9 (ref 5–15)
BUN: 16 mg/dL (ref 6–20)
CHLORIDE: 101 mmol/L (ref 101–111)
CO2: 27 mmol/L (ref 22–32)
CREATININE: 1.15 mg/dL — AB (ref 0.44–1.00)
Calcium: 9.1 mg/dL (ref 8.9–10.3)
GFR calc non Af Amer: 47 mL/min — ABNORMAL LOW (ref >60)
GFR, EST AFRICAN AMERICAN: 55 mL/min — AB (ref >60)
GLUCOSE: 112 mg/dL — AB (ref 65–99)
Potassium: 3.8 mmol/L (ref 3.5–5.1)
Sodium: 137 mmol/L (ref 135–145)

## 2017-04-03 ENCOUNTER — Encounter (HOSPITAL_COMMUNITY)
Admission: RE | Disposition: A | Discharge status: Home / Self Care | Payer: Self-pay | Source: Ambulatory Visit | Attending: General Surgery

## 2017-04-03 ENCOUNTER — Ambulatory Visit (HOSPITAL_COMMUNITY)
Admission: RE | Admit: 2017-04-03 | Discharge: 2017-04-03 | Disposition: A | Discharge status: Home / Self Care | Payer: Medicare Other | Source: Ambulatory Visit | Attending: General Surgery | Admitting: General Surgery

## 2017-04-03 ENCOUNTER — Ambulatory Visit (HOSPITAL_COMMUNITY): Payer: Medicare Other | Admitting: Anesthesiology

## 2017-04-03 ENCOUNTER — Other Ambulatory Visit: Payer: Self-pay | Admitting: General Surgery

## 2017-04-03 ENCOUNTER — Encounter (HOSPITAL_COMMUNITY): Payer: Self-pay | Admitting: *Deleted

## 2017-04-03 DIAGNOSIS — IMO0002 Reserved for concepts with insufficient information to code with codable children: Secondary | ICD-10-CM

## 2017-04-03 DIAGNOSIS — Z79899 Other long term (current) drug therapy: Secondary | ICD-10-CM | POA: Insufficient documentation

## 2017-04-03 DIAGNOSIS — Z9049 Acquired absence of other specified parts of digestive tract: Secondary | ICD-10-CM | POA: Insufficient documentation

## 2017-04-03 DIAGNOSIS — Z170 Estrogen receptor positive status [ER+]: Secondary | ICD-10-CM | POA: Diagnosis not present

## 2017-04-03 DIAGNOSIS — R229 Localized swelling, mass and lump, unspecified: Principal | ICD-10-CM

## 2017-04-03 DIAGNOSIS — C50419 Malignant neoplasm of upper-outer quadrant of unspecified female breast: Secondary | ICD-10-CM

## 2017-04-03 DIAGNOSIS — D649 Anemia, unspecified: Secondary | ICD-10-CM | POA: Diagnosis not present

## 2017-04-03 DIAGNOSIS — E079 Disorder of thyroid, unspecified: Secondary | ICD-10-CM | POA: Insufficient documentation

## 2017-04-03 DIAGNOSIS — Z87891 Personal history of nicotine dependence: Secondary | ICD-10-CM | POA: Insufficient documentation

## 2017-04-03 DIAGNOSIS — Z880 Allergy status to penicillin: Secondary | ICD-10-CM | POA: Diagnosis not present

## 2017-04-03 DIAGNOSIS — C50211 Malignant neoplasm of upper-inner quadrant of right female breast: Secondary | ICD-10-CM | POA: Diagnosis not present

## 2017-04-03 DIAGNOSIS — Z90710 Acquired absence of both cervix and uterus: Secondary | ICD-10-CM | POA: Diagnosis not present

## 2017-04-03 DIAGNOSIS — Z853 Personal history of malignant neoplasm of breast: Secondary | ICD-10-CM

## 2017-04-03 DIAGNOSIS — M1990 Unspecified osteoarthritis, unspecified site: Secondary | ICD-10-CM | POA: Diagnosis not present

## 2017-04-03 DIAGNOSIS — D0511 Intraductal carcinoma in situ of right breast: Secondary | ICD-10-CM | POA: Insufficient documentation

## 2017-04-03 DIAGNOSIS — J45909 Unspecified asthma, uncomplicated: Secondary | ICD-10-CM | POA: Insufficient documentation

## 2017-04-03 DIAGNOSIS — I10 Essential (primary) hypertension: Secondary | ICD-10-CM | POA: Insufficient documentation

## 2017-04-03 DIAGNOSIS — Z7982 Long term (current) use of aspirin: Secondary | ICD-10-CM | POA: Insufficient documentation

## 2017-04-03 DIAGNOSIS — C50911 Malignant neoplasm of unspecified site of right female breast: Secondary | ICD-10-CM | POA: Diagnosis present

## 2017-04-03 HISTORY — PX: PARTIAL MASTECTOMY WITH NEEDLE LOCALIZATION AND AXILLARY SENTINEL LYMPH NODE BX: SHX6009

## 2017-04-03 SURGERY — PARTIAL MASTECTOMY WITH NEEDLE LOCALIZATION AND AXILLARY SENTINEL LYMPH NODE BX
Anesthesia: General | Site: Breast | Laterality: Right

## 2017-04-03 MED ORDER — PROPOFOL 10 MG/ML IV BOLUS
INTRAVENOUS | Status: DC | PRN
  Administered 2017-04-03: 150 mg via INTRAVENOUS

## 2017-04-03 MED ORDER — FENTANYL CITRATE (PF) 100 MCG/2ML IJ SOLN
INTRAMUSCULAR | Status: DC | PRN
  Administered 2017-04-03 (×2): 25 ug via INTRAVENOUS
  Administered 2017-04-03 (×2): 50 ug via INTRAVENOUS

## 2017-04-03 MED ORDER — CHLORHEXIDINE GLUCONATE CLOTH 2 % EX PADS: 6.0000 | MEDICATED_PAD | Freq: Once | CUTANEOUS | Status: DC

## 2017-04-03 MED ORDER — DEXAMETHASONE SODIUM PHOSPHATE 4 MG/ML IJ SOLN
INTRAMUSCULAR | Status: DC | PRN
  Administered 2017-04-03: 4 mg via INTRAVENOUS

## 2017-04-03 MED ORDER — MIDAZOLAM HCL 2 MG/2ML IJ SOLN
1.0000 mg | Freq: Once | INTRAMUSCULAR | Status: AC | PRN
Start: 2017-04-03 — End: 2017-04-03
  Administered 2017-04-03: 2 mg via INTRAVENOUS

## 2017-04-03 MED ORDER — METHYLENE BLUE 0.5 % INJ SOLN
INTRAVENOUS | Status: DC | PRN
  Administered 2017-04-03: 4 mL

## 2017-04-03 MED ORDER — SODIUM CHLORIDE 0.9 % IJ SOLN
INTRAMUSCULAR | Status: AC
  Filled 2017-04-03: qty 10

## 2017-04-03 MED ORDER — HYDROCODONE-ACETAMINOPHEN 5-325 MG PO TABS: 1.0000 | ORAL_TABLET | Freq: Four times a day (QID) | ORAL | 0 refills | Status: DC | PRN

## 2017-04-03 MED ORDER — PROPOFOL 10 MG/ML IV BOLUS
INTRAVENOUS | Status: AC
  Filled 2017-04-03: qty 20

## 2017-04-03 MED ORDER — FENTANYL CITRATE (PF) 100 MCG/2ML IJ SOLN
INTRAMUSCULAR | Status: AC
  Filled 2017-04-03: qty 2

## 2017-04-03 MED ORDER — ONDANSETRON 4 MG PO TBDP
4.0000 mg | ORAL_TABLET | Freq: Once | ORAL | Status: AC
  Administered 2017-04-03: 4 mg via ORAL

## 2017-04-03 MED ORDER — METHYLENE BLUE 0.5 % INJ SOLN
INTRAVENOUS | Status: AC
  Filled 2017-04-03: qty 10

## 2017-04-03 MED ORDER — LACTATED RINGERS IV SOLN
INTRAVENOUS | Status: DC
Start: 2017-04-03 — End: 2017-04-03
  Administered 2017-04-03 (×2): via INTRAVENOUS

## 2017-04-03 MED ORDER — FENTANYL CITRATE (PF) 100 MCG/2ML IJ SOLN
25.0000 ug | INTRAMUSCULAR | Status: DC | PRN
  Administered 2017-04-03: 50 ug via INTRAVENOUS

## 2017-04-03 MED ORDER — ONDANSETRON HCL 4 MG/2ML IJ SOLN
INTRAMUSCULAR | Status: AC
  Filled 2017-04-03: qty 2

## 2017-04-03 MED ORDER — 0.9 % SODIUM CHLORIDE (POUR BTL) OPTIME
TOPICAL | Status: DC | PRN
  Administered 2017-04-03: 1000 mL

## 2017-04-03 MED ORDER — EPHEDRINE SULFATE 50 MG/ML IJ SOLN
INTRAMUSCULAR | Status: DC | PRN
  Administered 2017-04-03: 10 mg via INTRAVENOUS

## 2017-04-03 MED ORDER — MIDAZOLAM HCL 2 MG/2ML IJ SOLN
INTRAMUSCULAR | Status: AC
  Filled 2017-04-03: qty 2

## 2017-04-03 MED ORDER — ACETAMINOPHEN 325 MG PO TABS
650.0000 mg | ORAL_TABLET | Freq: Four times a day (QID) | ORAL | Status: DC | PRN
  Administered 2017-04-03: 650 mg via ORAL

## 2017-04-03 MED ORDER — ONDANSETRON 4 MG PO TBDP
ORAL_TABLET | ORAL | Status: AC
  Filled 2017-04-03: qty 1

## 2017-04-03 MED ORDER — TECHNETIUM TC 99M SULFUR COLLOID FILTERED
0.5000 | Freq: Once | INTRAVENOUS | Status: AC | PRN
  Administered 2017-04-03: 0.57 via INTRADERMAL

## 2017-04-03 MED ORDER — ACETAMINOPHEN 325 MG PO TABS
ORAL_TABLET | ORAL | Status: AC
  Filled 2017-04-03: qty 2

## 2017-04-03 MED ORDER — EPHEDRINE SULFATE 50 MG/ML IJ SOLN
INTRAMUSCULAR | Status: AC
  Filled 2017-04-03: qty 1

## 2017-04-03 MED ORDER — PENTAFLUOROPROP-TETRAFLUOROETH EX AERO
INHALATION_SPRAY | CUTANEOUS | Status: AC
  Filled 2017-04-03: qty 103.5

## 2017-04-03 MED ORDER — KETOROLAC TROMETHAMINE 30 MG/ML IJ SOLN
30.0000 mg | Freq: Once | INTRAMUSCULAR | Status: AC
  Administered 2017-04-03: 30 mg via INTRAVENOUS
  Filled 2017-04-03: qty 1

## 2017-04-03 MED ORDER — HEMOSTATIC AGENTS (NO CHARGE) OPTIME
TOPICAL | Status: DC | PRN
  Administered 2017-04-03: 1 via TOPICAL

## 2017-04-03 MED ORDER — LIDOCAINE HCL (CARDIAC) 20 MG/ML IV SOLN
INTRAVENOUS | Status: DC | PRN
  Administered 2017-04-03: 30 mg via INTRAVENOUS

## 2017-04-03 MED ORDER — ONDANSETRON HCL 4 MG/2ML IJ SOLN
INTRAMUSCULAR | Status: DC | PRN
  Administered 2017-04-03: 4 mg via INTRAVENOUS

## 2017-04-03 MED ORDER — BUPIVACAINE HCL (PF) 0.5 % IJ SOLN
INTRAMUSCULAR | Status: DC | PRN
  Administered 2017-04-03: 16 mL

## 2017-04-03 MED ORDER — TECHNETIUM TC 99M SULFUR COLLOID FILTERED
0.5000 | Freq: Once | INTRAVENOUS | Status: AC | PRN
  Administered 2017-04-03: 0.54 via INTRADERMAL

## 2017-04-03 MED ORDER — LIDOCAINE HCL (PF) 1 % IJ SOLN
INTRAMUSCULAR | Status: AC
Start: 2017-04-03 — End: ?
  Filled 2017-04-03: qty 5

## 2017-04-03 MED ORDER — DEXAMETHASONE SODIUM PHOSPHATE 4 MG/ML IJ SOLN
INTRAMUSCULAR | Status: AC
  Filled 2017-04-03: qty 1

## 2017-04-03 MED ORDER — LIDOCAINE HCL (PF) 2 % IJ SOLN
INTRAMUSCULAR | Status: AC
  Filled 2017-04-03: qty 10

## 2017-04-03 MED ORDER — VANCOMYCIN HCL IN DEXTROSE 1-5 GM/200ML-% IV SOLN
1000.0000 mg | INTRAVENOUS | Status: AC
  Administered 2017-04-03: 1000 mg via INTRAVENOUS
  Filled 2017-04-03: qty 200

## 2017-04-03 MED ORDER — BUPIVACAINE HCL (PF) 0.5 % IJ SOLN
INTRAMUSCULAR | Status: AC
  Filled 2017-04-03: qty 30

## 2017-04-03 MED ORDER — FENTANYL CITRATE (PF) 250 MCG/5ML IJ SOLN
INTRAMUSCULAR | Status: AC
  Filled 2017-04-03: qty 5

## 2017-04-03 SURGICAL SUPPLY — 41 items
APPLIER CLIP 9.375 SM OPEN (CLIP) ×6
BAG HAMPER (MISCELLANEOUS) ×3 IMPLANT
BNDG CONFORM 6X.82 1P STRL (GAUZE/BANDAGES/DRESSINGS) ×3 IMPLANT
CLIP APPLIE 9.375 SM OPEN (CLIP) ×2 IMPLANT
CLOTH BEACON ORANGE TIMEOUT ST (SAFETY) ×3 IMPLANT
CONT SPECI 4OZ STER CLIK (MISCELLANEOUS) ×3 IMPLANT
COVER LIGHT HANDLE STERIS (MISCELLANEOUS) ×6 IMPLANT
COVER PROBE W GEL 5X96 (DRAPES) ×3 IMPLANT
DECANTER SPIKE VIAL GLASS SM (MISCELLANEOUS) ×3 IMPLANT
DERMABOND ADVANCED (GAUZE/BANDAGES/DRESSINGS) ×2
DERMABOND ADVANCED .7 DNX12 (GAUZE/BANDAGES/DRESSINGS) ×1 IMPLANT
DURAPREP 26ML APPLICATOR (WOUND CARE) ×3 IMPLANT
ELECT REM PT RETURN 9FT ADLT (ELECTROSURGICAL) ×3
ELECTRODE REM PT RTRN 9FT ADLT (ELECTROSURGICAL) ×1 IMPLANT
GLOVE BIO SURGEON STRL SZ 6.5 (GLOVE) ×2 IMPLANT
GLOVE BIO SURGEONS STRL SZ 6.5 (GLOVE) ×1
GLOVE BIOGEL PI IND STRL 6.5 (GLOVE) ×2 IMPLANT
GLOVE BIOGEL PI IND STRL 7.0 (GLOVE) ×1 IMPLANT
GLOVE BIOGEL PI INDICATOR 6.5 (GLOVE) ×4
GLOVE BIOGEL PI INDICATOR 7.0 (GLOVE) ×2
GLOVE SURG SS PI 7.5 STRL IVOR (GLOVE) ×6 IMPLANT
GOWN STRL REUS W/ TWL LRG LVL3 (GOWN DISPOSABLE) ×1 IMPLANT
GOWN STRL REUS W/TWL LRG LVL3 (GOWN DISPOSABLE) ×11 IMPLANT
HEMOSTAT ARISTA ABSORB 1G (MISCELLANEOUS) ×3 IMPLANT
KIT ROOM TURNOVER APOR (KITS) ×3 IMPLANT
MANIFOLD NEPTUNE II (INSTRUMENTS) ×3 IMPLANT
NEEDLE HYPO 18GX1.5 BLUNT FILL (NEEDLE) ×3 IMPLANT
NEEDLE HYPO 25X1 1.5 SAFETY (NEEDLE) ×3 IMPLANT
NS IRRIG 1000ML POUR BTL (IV SOLUTION) ×3 IMPLANT
PACK MINOR (CUSTOM PROCEDURE TRAY) ×3 IMPLANT
PAD ARMBOARD 7.5X6 YLW CONV (MISCELLANEOUS) ×3 IMPLANT
SET BASIN LINEN APH (SET/KITS/TRAYS/PACK) ×3 IMPLANT
SPONGE GAUZE 2X2 8PLY STER LF (GAUZE/BANDAGES/DRESSINGS)
SPONGE GAUZE 2X2 8PLY STRL LF (GAUZE/BANDAGES/DRESSINGS) IMPLANT
SPONGE LAP 18X18 X RAY DECT (DISPOSABLE) ×3 IMPLANT
STOCKINETTE IMPERVIOUS LG (DRAPES) ×3 IMPLANT
SUT SILK 0 FSL (SUTURE) ×3 IMPLANT
SUT VIC AB 3-0 SH 27 (SUTURE) ×2
SUT VIC AB 3-0 SH 27X BRD (SUTURE) ×1 IMPLANT
SUT VIC AB 4-0 PS2 27 (SUTURE) ×6 IMPLANT
SYRINGE 10CC LL (SYRINGE) ×3 IMPLANT

## 2017-04-03 NOTE — Anesthesia Preprocedure Evaluation (Addendum)
Anesthesia Evaluation  Patient identified by MRN, date of birth, ID band Patient awake    Airway Mallampati: I  TM Distance: >3 FB Neck ROM: Full    Dental  (+) Teeth Intact   Pulmonary asthma (minimal effects) , former smoker,    Pulmonary exam normal breath sounds clear to auscultation       Cardiovascular Exercise Tolerance: Good hypertension (Stable), Pt. on medications Normal cardiovascular exam Rhythm:Regular Rate:Normal     Neuro/Psych    GI/Hepatic   Endo/Other    Renal/GU      Musculoskeletal  (+) Arthritis ,   Abdominal   Peds  Hematology  (+) anemia , CBC        Component                Value               Date/Time                 WBC                      10.1                04/02/2017 1129           RBC                      3.69 (L)            04/02/2017 1129           HGB                      10.8 (L)            04/02/2017 1129           HCT                      33.8 (L)            04/02/2017 1129           PLT                      414 (H)             04/02/2017 1129           MCV                      91.6                04/02/2017 1129           MCH                      29.3                04/02/2017 1129           MCHC                     32.0                04/02/2017 1129           RDW                      13.7                04/02/2017 1129  LYMPHSABS                1.8                 04/02/2017 1129           MONOABS                  0.5                 04/02/2017 1129           EOSABS                   0.5                 04/02/2017 1129           BASOSABS                 0.0                 04/02/2017 1129        Anesthesia Other Findings   Reproductive/Obstetrics                             Anesthesia Physical Anesthesia Plan  ASA: II  Anesthesia Plan: General   Post-op Pain Management:    Induction: Intravenous  PONV Risk Score and Plan:  Ondansetron and Dexamethasone  Airway Management Planned: LMA  Additional Equipment:   Intra-op Plan:   Post-operative Plan: Extubation in OR  Informed Consent: I have reviewed the patients History and Physical, chart, labs and discussed the procedure including the risks, benefits and alternatives for the proposed anesthesia with the patient or authorized representative who has indicated his/her understanding and acceptance.   Dental advisory given  Plan Discussed with: CRNA  Anesthesia Plan Comments:         Anesthesia Quick Evaluation

## 2017-04-03 NOTE — Discharge Instructions (Signed)
Partial Mastectomy With or Without Axillary Lymph Node Removal, Care After °Refer to this sheet in the next few weeks. These instructions provide you with information about caring for yourself after your procedure. Your health care provider may also give you more specific instructions. Your treatment has been planned according to current medical practices, but problems sometimes occur. Call your health care provider if you have any problems or questions after your procedure. °What can I expect after the procedure? °After your procedure, it is common to have: °· Breast swelling. °· Breast tenderness. °· Stiffness in your arm or shoulder. °· A change in the shape and feel of your breast. ° °Follow these instructions at home: °Bathing °· Take sponge baths until your health care provider says that you can start showering or bathing. °· Do not take baths, swim, or use a hot tub until your health care provider approves. °Incision care °· There are many different ways to close and cover an incision, including stitches, skin glue, and adhesive strips. Follow your health care provider's instructions about: °? Incision care. °? Bandage (dressing) changes and removal. °? Incision closure removal. °· Check your incision area every day for signs of infection. Watch for: °? Redness, swelling, or pain. °? Fluid, blood, or pus. °· If you were sent home with a surgical drain in place, follow your health care provider's instructions for emptying it. °Activity °· Return to your normal activities as directed by your health care provider. °· Avoid strenuous exercise. °· Be careful to avoid any activities that could cause an injury to your arm on the side of your surgery. °· Do not lift anything that is heavier than 10 lb (4.5 kg). Avoid lifting with the arm that is on the side of your surgery. °· Do not carry heavy objects on your shoulder. °· After your drain is removed, you should perform exercises to keep your arm from getting stiff  and swollen. Talk with your health care provider about which exercises are safe for you. °General instructions °· Take medicines only as directed by your health care provider. °· Keep your dressing clean and dry. °· You may eat what you usually do. °· Wear a supportive bra as directed by your health care provider. °· Keep your arm elevated when at rest. °· Do not wear tight jewelry on your arm, wrist, or fingers on the side of your surgery. °· Get checked for extra fluid around your lymph nodes (lymphedema) as often as told by your health care provider. °· If you had any lymph nodes removed during your procedure, be sure to tell all of your health care providers. This is important information to share before you are involved in certain procedures, such as giving blood or having your blood pressure taken. °Contact a health care provider if: °· You have a fever. °· Your pain medicine is not working. °· Your swelling, weakness, or numbness in your arm has not improved after a few weeks. °· You have new swelling in your breast or arm. °· You have redness, swelling, or pain in your incision area. °· You have fluid, blood, or pus coming from your incision. °Get help right away if: °· You have very bad pain in your breast or arm. °· You have chest pain. °· You have difficulty breathing. °This information is not intended to replace advice given to you by your health care provider. Make sure you discuss any questions you have with your health care provider. °Document Released: 03/13/2004 Document   Revised: 04/05/2016 Document Reviewed: 04/14/2014 °Elsevier Interactive Patient Education © 2018 [employer] ° ° °PATIENT INSTRUCTIONS °POST-ANESTHESIA ° °IMMEDIATELY FOLLOWING SURGERY:  Do not drive or operate machinery for the first twenty four hours after surgery.  Do not make any important decisions for twenty four hours after surgery or while taking narcotic pain medications or sedatives.  If you develop intractable nausea  and vomiting or a severe headache please notify your doctor immediately. ° °FOLLOW-UP:  Please make an appointment with your surgeon as instructed. You do not need to follow up with anesthesia unless specifically instructed to do so. ° °WOUND CARE INSTRUCTIONS (if applicable):  Keep a dry clean dressing on the anesthesia/puncture wound site if there is drainage.  Once the wound has quit draining you may leave it open to air.  Generally you should leave the bandage intact for twenty four hours unless there is drainage.  If the epidural site drains for more than 36-48 hours please call the anesthesia department. ° °QUESTIONS?:  Please feel free to call your physician or the hospital operator if you have any questions, and they will be happy to assist you.    ° ° ° °

## 2017-04-03 NOTE — Transfer of Care (Signed)
Immediate Anesthesia Transfer of Care Note  Patient: Sara Solomon  Procedure(s) Performed: Procedure(s): PARTIAL MASTECTOMY WITH NEEDLE LOCALIZATION AND AXILLARY SENTINEL LYMPH NODE BX (Right)  Patient Location: PACU  Anesthesia Type:General  Level of Consciousness: awake, oriented and patient cooperative  Airway & Oxygen Therapy: Patient Spontanous Breathing and Patient connected to nasal cannula oxygen  Post-op Assessment: Report given to RN and Post -op Vital signs reviewed and stable  Post vital signs: Reviewed and stable  Last Vitals:  Vitals:   04/03/17 0920 04/03/17 0925  BP: 118/74 108/69  Pulse:    Resp: 13 (!) 22  Temp:    SpO2: 94% 96%    Last Pain:  Vitals:   04/03/17 0752  TempSrc: Oral      Patients Stated Pain Goal: 5 (45/80/99 8338)  Complications: No apparent anesthesia complications

## 2017-04-03 NOTE — Anesthesia Postprocedure Evaluation (Signed)
Anesthesia Post Note Late Entry for 1140  Patient: Sara Solomon  Procedure(s) Performed: Procedure(s) (LRB): PARTIAL MASTECTOMY WITH NEEDLE LOCALIZATION AND AXILLARY SENTINEL LYMPH NODE BX (Right)  Patient location during evaluation: PACU Anesthesia Type: General Level of consciousness: awake and alert, oriented and patient cooperative Pain management: pain level controlled Vital Signs Assessment: post-procedure vital signs reviewed and stable Respiratory status: spontaneous breathing Cardiovascular status: stable Postop Assessment: no signs of nausea or vomiting Anesthetic complications: no     Last Vitals:  Vitals:   04/03/17 1145 04/03/17 1200  BP: (!) 118/58 120/60  Pulse: 67 65  Resp: 17 17  Temp:    SpO2: 100% 100%    Last Pain:  Vitals:   04/03/17 1200  TempSrc:   PainSc: 10-Worst pain ever                 Sherrie Marsan A

## 2017-04-03 NOTE — Op Note (Signed)
Patient:  Sara Solomon  DOB:  23-Nov-1945  MRN:  [id number]   Preop Diagnosis:  Invasive ductal carcinoma, right breast  Postop Diagnosis:  Same  Procedure:  Right partial mastectomy after needle localization, sentinel lymph node biopsy  Surgeon:  Aviva Signs, M.D.  Asst.: Blake Divine, M.D.  Anes:  Gen.  Indications:  Patient is a 71 year old white female who was recently found to have invasive ductal carcinoma the right breast. After extensive discussion with the patient, she has elected to proceed with partial mastectomy, sentinel lymph node biopsy, and postoperative radiation therapy. The risks and benefits of the procedure including bleeding, infection, nerve injury, and the possibility of unclear margins were fully explained to the patient, who gave informed consent.  Procedure note:  The patient had already undergone needle localization and radioactive nuclide injection in the preoperative area. She was brought back to the operating room. After general anesthesia was administered, the right breast needle localization site was injected with methylene blue and massaged into place for 5 minutes. The right breast and axilla were then prepped and draped using usual sterile technique with DuraPrep. Surgical site confirmation was performed.  Using a neoprobe, an incision was made in the right axilla down to what appeared to be 2 lymph nodes. One of them was blue. A bleeding was controlled using small clips. The axillary lymph nodes were sent to pathology for frozen section. Frozen section did reveal one lymph node that was negative for malignancy. Arista was placed in the base of the right axillary dissection. The subcutaneous layer was reapproximated using 3-0 Vicryl interrupted suture. 0.5% Sensorcaine was instilled into the surrounding wound. The skin was closed using a 4-0 Vicryl subcuticular suture. Dermabond was applied at the end of the procedure.  An incision was made in the upper,  inner quadrant of the right breast to include the needle. Using the guidewire, a right partial mastectomy was performed. The breast tissue was removed and sent to radiology. Specimen radiography revealed the suspicious area and clip to be within the specimen removed. It was then sent to pathology for further examination. And bleeding was controlled using Bovie electrocautery. 0.5% Sensorcaine was instilled into the surrounding wound. The subcutaneous layer was reapproximated using a 3-0 Vicryl interrupted suture. The skin was closed using a 4-0 Vicryl subcuticular suture. Dermabond was applied.  All tape and needle counts were correct at the end of both procedures. The patient was awakened and transferred to PACU in stable condition.  Complications:  None  EBL:  Minimal  Specimen:  Sentinel lymph nodes, right axilla Right breast tissue

## 2017-04-03 NOTE — Anesthesia Procedure Notes (Signed)
Procedure Name: LMA Insertion Date/Time: 04/03/2017 10:00 AM Performed by: Andree Elk, Alonte Wulff A Pre-anesthesia Checklist: Patient identified, Timeout performed, Emergency Drugs available, Suction available and Patient being monitored Patient Re-evaluated:Patient Re-evaluated prior to induction Oxygen Delivery Method: Circle system utilized Preoxygenation: Pre-oxygenation with 100% oxygen Induction Type: IV induction Ventilation: Mask ventilation without difficulty LMA: LMA inserted LMA Size: 4.0 Number of attempts: 1 Placement Confirmation: positive ETCO2 and breath sounds checked- equal and bilateral Tube secured with: Tape Dental Injury: Teeth and Oropharynx as per pre-operative assessment

## 2017-04-03 NOTE — Interval H&P Note (Signed)
History and Physical Interval Note:  04/03/2017 9:19 AM  Sara Solomon  has presented today for surgery, with the diagnosis of right breast cancer  The various methods of treatment have been discussed with the patient and family. After consideration of risks, benefits and other options for treatment, the patient has consented to  Procedure(s): PARTIAL MASTECTOMY WITH NEEDLE LOCALIZATION AND AXILLARY SENTINEL LYMPH NODE BX (Right) as a surgical intervention .  The patient's history has been reviewed, patient examined, no change in status, stable for surgery.  I have reviewed the patient's chart and labs.  Questions were answered to the patient's satisfaction.     Aviva Signs

## 2017-04-04 ENCOUNTER — Encounter (HOSPITAL_COMMUNITY): Payer: Self-pay | Admitting: General Surgery

## 2017-04-09 ENCOUNTER — Encounter: Payer: Self-pay | Admitting: General Surgery

## 2017-04-09 ENCOUNTER — Ambulatory Visit (INDEPENDENT_AMBULATORY_CARE_PROVIDER_SITE_OTHER): Payer: Medicare Other | Admitting: General Surgery

## 2017-04-09 VITALS — BP 139/81 | HR 83 | Temp 99.5°F | Resp 18 | Ht 65.0 in | Wt 181.0 lb

## 2017-04-09 DIAGNOSIS — Z09 Encounter for follow-up examination after completed treatment for conditions other than malignant neoplasm: Secondary | ICD-10-CM

## 2017-04-09 NOTE — Progress Notes (Signed)
Subjective:     Sara Solomon    Follow-up status post right partial mastectomy with sentinel lymph node biopsy.  Patient is doing well.  She has no complaints. Objective:    BP 139/81   Pulse 83   Temp 99.5 F (37.5 C)   Resp 18   Ht 5\' 5"  (1.651 m)   Wt 181 lb (82.1 kg)   BMI 30.12 kg/m   General:  alert, cooperative and no distress    Right breast incision is healing well.  Right axillary incision healing well.   Final pathology reveals zero out of three sentinel lymph nodes positive.  There was a positive surgical margin in the partial mastectomy.     Assessment:    Doing well postoperatively.     Positive surgical margin   Plan:   I did explain to the patient that we will need to re-excise the breast tissue due to the surgical margin.  This has been scheduled for 04/26/2018.  The risks and benefits of the procedure were fully explained to the patient, who gave informed consent.

## 2017-04-09 NOTE — H&P (Signed)
Sara Solomon is an 71 y.o. female.   Chief Complaint:   Right breast carcinoma HPI:  Patient is a 71 year old white female status post right partial mastectomy with sentinel lymph node biopsy who was found on final pathology to have an unclear margin in the right breast.  She now presents for reexcision to obtain clear surgical margins.  Past Medical History:  Diagnosis Date  . Anemia   . Anxiety   . Arthritis   . Asthma   . Hypertension   . Hypothyroidism   . Thyroid disease     Past Surgical History:  Procedure Laterality Date  . ABDOMINAL HYSTERECTOMY    . CATARACT EXTRACTION, BILATERAL    . CHOLECYSTECTOMY    . PARTIAL MASTECTOMY WITH NEEDLE LOCALIZATION AND AXILLARY SENTINEL LYMPH NODE BX Right 04/03/2017   Procedure: PARTIAL MASTECTOMY WITH NEEDLE LOCALIZATION AND AXILLARY SENTINEL LYMPH NODE BX;  Surgeon: Aviva Signs, MD;  Location: [facility];  Service: General;  Laterality: Right;    No family history on file. Social History:  reports that she has quit smoking. She has never used smokeless tobacco. She reports that she does not drink alcohol or use drugs.  Allergies:  Allergies  Allergen Reactions  . Other Anaphylaxis and Palpitations    Nuts   . Penicillins Hives    Has patient had a PCN reaction causing immediate rash, facial/tongue/throat swelling, SOB or lightheadedness with hypotension: No Has patient had a PCN reaction causing severe rash involving mucus membranes or skin necrosis: No Has patient had a PCN reaction that required hospitalization: No Has patient had a PCN reaction occurring within the last 10 years: No If all of the above answers are "NO", then may proceed with Cephalosporin use.     No prescriptions prior to admission.    No results found for this or any previous visit (from the past 48 hour(s)). No results found.  Review of Systems  Constitutional: Negative.   HENT: Negative.   Eyes: Negative.   Respiratory: Negative.   Cardiovascular:  Negative.   Gastrointestinal: Negative.   Genitourinary: Negative.   Musculoskeletal: Negative.   Skin: Negative.   Neurological: Negative.   Endo/Heme/Allergies: Negative.   Psychiatric/Behavioral: Negative.     There were no vitals taken for this visit. Physical Exam  Vitals reviewed. Constitutional: She is oriented to person, place, and time. She appears well-developed and well-nourished.  HENT:  Head: Normocephalic and atraumatic.  Cardiovascular: Normal rate, regular rhythm and normal heart sounds.  Exam reveals no friction rub.   No murmur heard. Respiratory: Effort normal and breath sounds normal. No respiratory distress. She has no wheezes. She has no rales.  Neurological: She is alert and oriented to person, place, and time.  Skin: Skin is warm and dry.     Breasts breast examination reveals well-healed surgical scar.  Right axillary surgical scar well-healed.  Assessment/Plan   Impression: Right breast carcinoma, unclear surgical margin, status post right partial mastectomy with sentinel lymph node biopsy.   Patient is scheduled for reexcision right partial mastectomy on 04/26/2017.  Risks and benefits of procedure including bleeding, infection, and the possibility of unclear margins were fully explained to the patient, who gave informed consent.  Aviva Signs, MD 04/09/2017, 9:17 PM

## 2017-04-22 ENCOUNTER — Encounter (HOSPITAL_COMMUNITY)
Admission: RE | Admit: 2017-04-22 | Discharge: 2017-04-22 | Disposition: A | Discharge status: Home / Self Care | Payer: Medicare Other | Source: Ambulatory Visit | Attending: General Surgery | Admitting: General Surgery

## 2017-04-22 ENCOUNTER — Encounter (HOSPITAL_COMMUNITY): Payer: Self-pay

## 2017-05-01 ENCOUNTER — Ambulatory Visit (HOSPITAL_COMMUNITY)
Admission: RE | Admit: 2017-05-01 | Discharge: 2017-05-01 | Disposition: A | Discharge status: Home / Self Care | Payer: Medicare Other | Source: Ambulatory Visit | Attending: General Surgery | Admitting: General Surgery

## 2017-05-01 ENCOUNTER — Encounter (HOSPITAL_COMMUNITY)
Admission: RE | Disposition: A | Discharge status: Home / Self Care | Payer: Self-pay | Source: Ambulatory Visit | Attending: General Surgery

## 2017-05-01 ENCOUNTER — Ambulatory Visit (HOSPITAL_COMMUNITY): Payer: Medicare Other | Admitting: Anesthesiology

## 2017-05-01 ENCOUNTER — Encounter (HOSPITAL_COMMUNITY): Payer: Self-pay | Admitting: Anesthesiology

## 2017-05-01 DIAGNOSIS — M1990 Unspecified osteoarthritis, unspecified site: Secondary | ICD-10-CM | POA: Diagnosis not present

## 2017-05-01 DIAGNOSIS — F419 Anxiety disorder, unspecified: Secondary | ICD-10-CM | POA: Insufficient documentation

## 2017-05-01 DIAGNOSIS — Z9842 Cataract extraction status, left eye: Secondary | ICD-10-CM | POA: Insufficient documentation

## 2017-05-01 DIAGNOSIS — Z87891 Personal history of nicotine dependence: Secondary | ICD-10-CM | POA: Insufficient documentation

## 2017-05-01 DIAGNOSIS — J45909 Unspecified asthma, uncomplicated: Secondary | ICD-10-CM | POA: Diagnosis not present

## 2017-05-01 DIAGNOSIS — Z91018 Allergy to other foods: Secondary | ICD-10-CM | POA: Diagnosis not present

## 2017-05-01 DIAGNOSIS — Z880 Allergy status to penicillin: Secondary | ICD-10-CM | POA: Insufficient documentation

## 2017-05-01 DIAGNOSIS — D649 Anemia, unspecified: Secondary | ICD-10-CM | POA: Insufficient documentation

## 2017-05-01 DIAGNOSIS — I10 Essential (primary) hypertension: Secondary | ICD-10-CM | POA: Insufficient documentation

## 2017-05-01 DIAGNOSIS — E039 Hypothyroidism, unspecified: Secondary | ICD-10-CM | POA: Insufficient documentation

## 2017-05-01 DIAGNOSIS — C50111 Malignant neoplasm of central portion of right female breast: Secondary | ICD-10-CM

## 2017-05-01 DIAGNOSIS — Z9841 Cataract extraction status, right eye: Secondary | ICD-10-CM | POA: Diagnosis not present

## 2017-05-01 DIAGNOSIS — Z9049 Acquired absence of other specified parts of digestive tract: Secondary | ICD-10-CM | POA: Insufficient documentation

## 2017-05-01 DIAGNOSIS — K219 Gastro-esophageal reflux disease without esophagitis: Secondary | ICD-10-CM | POA: Diagnosis not present

## 2017-05-01 DIAGNOSIS — C50911 Malignant neoplasm of unspecified site of right female breast: Secondary | ICD-10-CM | POA: Diagnosis present

## 2017-05-01 DIAGNOSIS — Z90710 Acquired absence of both cervix and uterus: Secondary | ICD-10-CM | POA: Diagnosis not present

## 2017-05-01 HISTORY — PX: MASTECTOMY, PARTIAL: SHX709

## 2017-05-01 SURGERY — MASTECTOMY PARTIAL
Anesthesia: General | Site: Breast | Laterality: Right

## 2017-05-01 MED ORDER — KETOROLAC TROMETHAMINE 30 MG/ML IJ SOLN
INTRAMUSCULAR | Status: AC
  Filled 2017-05-01: qty 1

## 2017-05-01 MED ORDER — ONDANSETRON HCL 4 MG/2ML IJ SOLN
4.0000 mg | Freq: Once | INTRAMUSCULAR | Status: AC
  Administered 2017-05-01: 4 mg via INTRAVENOUS

## 2017-05-01 MED ORDER — ONDANSETRON HCL 4 MG/2ML IJ SOLN
INTRAMUSCULAR | Status: AC
  Filled 2017-05-01: qty 2

## 2017-05-01 MED ORDER — MIDAZOLAM HCL 2 MG/2ML IJ SOLN
1.0000 mg | INTRAMUSCULAR | Status: AC
  Administered 2017-05-01 (×2): 2 mg via INTRAVENOUS
  Filled 2017-05-01: qty 2

## 2017-05-01 MED ORDER — HYDROCODONE-ACETAMINOPHEN 5-325 MG PO TABS: 1.0000 | ORAL_TABLET | Freq: Four times a day (QID) | ORAL | 0 refills | Status: DC | PRN

## 2017-05-01 MED ORDER — MIDAZOLAM HCL 2 MG/2ML IJ SOLN
INTRAMUSCULAR | Status: AC
  Filled 2017-05-01: qty 2

## 2017-05-01 MED ORDER — EPHEDRINE SULFATE 50 MG/ML IJ SOLN
INTRAMUSCULAR | Status: DC | PRN
  Administered 2017-05-01: 10 mg via INTRAVENOUS

## 2017-05-01 MED ORDER — LACTATED RINGERS IV SOLN
INTRAVENOUS | Status: DC
  Administered 2017-05-01: 1000 mL via INTRAVENOUS

## 2017-05-01 MED ORDER — BUPIVACAINE HCL (PF) 0.5 % IJ SOLN
INTRAMUSCULAR | Status: DC | PRN
  Administered 2017-05-01: 10 mL

## 2017-05-01 MED ORDER — SODIUM CHLORIDE 0.9 % IR SOLN
Status: DC | PRN
  Administered 2017-05-01: 1000 mL

## 2017-05-01 MED ORDER — VANCOMYCIN HCL IN DEXTROSE 1-5 GM/200ML-% IV SOLN
INTRAVENOUS | Status: AC
  Filled 2017-05-01: qty 200

## 2017-05-01 MED ORDER — CHLORHEXIDINE GLUCONATE CLOTH 2 % EX PADS: 6.0000 | MEDICATED_PAD | Freq: Once | CUTANEOUS | Status: DC

## 2017-05-01 MED ORDER — FENTANYL CITRATE (PF) 100 MCG/2ML IJ SOLN
INTRAMUSCULAR | Status: DC | PRN
  Administered 2017-05-01 (×3): 25 ug via INTRAVENOUS
  Administered 2017-05-01: 50 ug via INTRAVENOUS

## 2017-05-01 MED ORDER — LIDOCAINE HCL (CARDIAC) 20 MG/ML IV SOLN
INTRAVENOUS | Status: DC | PRN
  Administered 2017-05-01: 50 mg via INTRAVENOUS

## 2017-05-01 MED ORDER — DEXTROSE 5 % IV SOLN
INTRAVENOUS | Status: DC | PRN
  Administered 2017-05-01: 07:00:00 via INTRAVENOUS

## 2017-05-01 MED ORDER — FENTANYL CITRATE (PF) 100 MCG/2ML IJ SOLN
INTRAMUSCULAR | Status: AC
  Filled 2017-05-01: qty 2

## 2017-05-01 MED ORDER — FENTANYL CITRATE (PF) 100 MCG/2ML IJ SOLN
25.0000 ug | INTRAMUSCULAR | Status: DC | PRN
  Administered 2017-05-01: 50 ug via INTRAVENOUS
  Filled 2017-05-01: qty 2

## 2017-05-01 MED ORDER — BUPIVACAINE HCL (PF) 0.5 % IJ SOLN
INTRAMUSCULAR | Status: AC
  Filled 2017-05-01: qty 30

## 2017-05-01 MED ORDER — LIDOCAINE HCL (PF) 1 % IJ SOLN
INTRAMUSCULAR | Status: AC
  Filled 2017-05-01: qty 5

## 2017-05-01 MED ORDER — KETOROLAC TROMETHAMINE 30 MG/ML IJ SOLN
30.0000 mg | Freq: Once | INTRAMUSCULAR | Status: AC
  Administered 2017-05-01: 30 mg via INTRAVENOUS

## 2017-05-01 MED ORDER — PROPOFOL 10 MG/ML IV BOLUS
INTRAVENOUS | Status: DC | PRN
  Administered 2017-05-01: 20 mg via INTRAVENOUS
  Administered 2017-05-01: 150 mg via INTRAVENOUS
  Administered 2017-05-01: 50 mg via INTRAVENOUS

## 2017-05-01 MED ORDER — VANCOMYCIN HCL IN DEXTROSE 1-5 GM/200ML-% IV SOLN
1000.0000 mg | INTRAVENOUS | Status: AC
  Administered 2017-05-01: 1000 mg via INTRAVENOUS

## 2017-05-01 MED ORDER — DEXAMETHASONE SODIUM PHOSPHATE 4 MG/ML IJ SOLN
4.0000 mg | Freq: Once | INTRAMUSCULAR | Status: AC
  Administered 2017-05-01: 4 mg via INTRAVENOUS
  Filled 2017-05-01: qty 1

## 2017-05-01 MED ORDER — PROPOFOL 10 MG/ML IV BOLUS
INTRAVENOUS | Status: AC
  Filled 2017-05-01: qty 40

## 2017-05-01 SURGICAL SUPPLY — 26 items
BAG HAMPER (MISCELLANEOUS) ×3 IMPLANT
CHLORAPREP W/TINT 26ML (MISCELLANEOUS) ×3 IMPLANT
CLOTH BEACON ORANGE TIMEOUT ST (SAFETY) ×3 IMPLANT
COVER LIGHT HANDLE STERIS (MISCELLANEOUS) ×6 IMPLANT
DECANTER SPIKE VIAL GLASS SM (MISCELLANEOUS) ×3 IMPLANT
DERMABOND ADVANCED (GAUZE/BANDAGES/DRESSINGS) ×2
DERMABOND ADVANCED .7 DNX12 (GAUZE/BANDAGES/DRESSINGS) ×1 IMPLANT
ELECT REM PT RETURN 9FT ADLT (ELECTROSURGICAL) ×3
ELECTRODE REM PT RTRN 9FT ADLT (ELECTROSURGICAL) ×1 IMPLANT
GLOVE BIOGEL PI IND STRL 7.0 (GLOVE) ×1 IMPLANT
GLOVE BIOGEL PI INDICATOR 7.0 (GLOVE) ×2
GLOVE SURG SS PI 7.5 STRL IVOR (GLOVE) ×6 IMPLANT
GOWN STRL REUS W/TWL LRG LVL3 (GOWN DISPOSABLE) ×6 IMPLANT
KIT ROOM TURNOVER APOR (KITS) ×3 IMPLANT
MANIFOLD NEPTUNE II (INSTRUMENTS) ×3 IMPLANT
NEEDLE HYPO 25X1 1.5 SAFETY (NEEDLE) ×3 IMPLANT
NS IRRIG 1000ML POUR BTL (IV SOLUTION) ×3 IMPLANT
PACK MINOR (CUSTOM PROCEDURE TRAY) ×3 IMPLANT
PAD ARMBOARD 7.5X6 YLW CONV (MISCELLANEOUS) ×3 IMPLANT
SET BASIN LINEN APH (SET/KITS/TRAYS/PACK) ×3 IMPLANT
SPONGE LAP 18X18 X RAY DECT (DISPOSABLE) ×3 IMPLANT
SUT SILK 0 FSL (SUTURE) ×3 IMPLANT
SUT VIC AB 3-0 SH 27 (SUTURE) ×2
SUT VIC AB 3-0 SH 27X BRD (SUTURE) ×1 IMPLANT
SUT VIC AB 4-0 PS2 27 (SUTURE) ×3 IMPLANT
SYR CONTROL 10ML LL (SYRINGE) ×3 IMPLANT

## 2017-05-01 NOTE — Transfer of Care (Signed)
Immediate Anesthesia Transfer of Care Note  Patient: Sara Solomon  Procedure(s) Performed: Procedure(s): MASTECTOMY PARTIAL (Right)  Patient Location: PACU  Anesthesia Type:General  Level of Consciousness: drowsy and patient cooperative  Airway & Oxygen Therapy: Patient Spontanous Breathing  Post-op Assessment: Report given to RN and Post -op Vital signs reviewed and stable  Post vital signs: Reviewed and stable  Last Vitals:  Vitals:   05/01/17 0720 05/01/17 0725  BP: 109/62 101/62  Pulse:    Resp: (!) 67 (!) 21  Temp:    SpO2: 96% 96%    Last Pain: There were no vitals filed for this visit.       Complications: No apparent anesthesia complications

## 2017-05-01 NOTE — Anesthesia Postprocedure Evaluation (Signed)
Anesthesia Post Note  Patient: RUSSIE GULLEDGE  Procedure(s) Performed: Procedure(s) (LRB): MASTECTOMY PARTIAL (Right)  Patient location during evaluation: PACU Anesthesia Type: General Level of consciousness: awake and alert, oriented and patient cooperative Pain management: pain level controlled Vital Signs Assessment: post-procedure vital signs reviewed and stable Respiratory status: spontaneous breathing Cardiovascular status: stable Postop Assessment: no apparent nausea or vomiting Anesthetic complications: no     Last Vitals:  Vitals:   05/01/17 0847 05/01/17 0905  BP: (!) 123/56 127/68  Pulse: 69 71  Resp: 13 14  Temp:    SpO2:      Last Pain:  Vitals:   05/01/17 0905  PainSc: 8                  ADAMS, AMY A

## 2017-05-01 NOTE — Interval H&P Note (Signed)
History and Physical Interval Note:  05/01/2017 7:10 AM  Sara Solomon  has presented today for surgery, with the diagnosis of right breast cancer  The various methods of treatment have been discussed with the patient and family. After consideration of risks, benefits and other options for treatment, the patient has consented to  Procedure(s): MASTECTOMY PARTIAL (Right) as a surgical intervention .  The patient's history has been reviewed, patient examined, no change in status, stable for surgery.  I have reviewed the patient's chart and labs.  Questions were answered to the patient's satisfaction.     Aviva Signs

## 2017-05-01 NOTE — Anesthesia Preprocedure Evaluation (Addendum)
Anesthesia Evaluation  Patient identified by MRN, date of birth, ID band Patient awake    Airway Mallampati: II  TM Distance: >3 FB Neck ROM: Full    Dental  (+) Edentulous Upper   Pulmonary asthma (minimal effects) , former smoker,    Pulmonary exam normal breath sounds clear to auscultation       Cardiovascular Exercise Tolerance: Good hypertension, Pt. on medications Normal cardiovascular exam Rhythm:Regular Rate:Normal     Neuro/Psych    GI/Hepatic negative GI ROS, Neg liver ROS, GERD  Controlled and Medicated,  Endo/Other  Hypothyroidism   Renal/GU      Musculoskeletal  (+) Arthritis ,   Abdominal   Peds  Hematology  (+) anemia ,   Anesthesia Other Findings   Reproductive/Obstetrics                            Anesthesia Physical Anesthesia Plan  ASA: II  Anesthesia Plan: General   Post-op Pain Management:    Induction: Intravenous  PONV Risk Score and Plan: Ondansetron and Dexamethasone  Airway Management Planned: LMA  Additional Equipment:   Intra-op Plan:   Post-operative Plan: Extubation in OR  Informed Consent: I have reviewed the patients History and Physical, chart, labs and discussed the procedure including the risks, benefits and alternatives for the proposed anesthesia with the patient or authorized representative who has indicated his/her understanding and acceptance.   Dental advisory given  Plan Discussed with: CRNA  Anesthesia Plan Comments:         Anesthesia Quick Evaluation

## 2017-05-01 NOTE — Op Note (Signed)
Patient:  Sara Solomon  DOB:  02/28/1946  MRN:  [id number]   Preop Diagnosis:  Right breast cancer, involved margin  Postop Diagnosis:  Same  Procedure:  Right breast biopsy  Surgeon:  Aviva Signs, M.D.  Anes:  Gen.  Indications:  Patient is a 71 year old white female status post right partial mastectomy who was found on final pathology to have an involved margin. The patient now comes for re-excisional biopsy of the site. The risks and benefits of the procedure including bleeding, infection, and unclear margins were fully explained to the patient, who gave informed consent.  Procedure note:  The patient was placed in supine position. After general anesthesia was monitored, the right breast was prepped and draped using usual sterile technique with DuraPrep. Surgical site confirmation was performed.  The previous surgical incision along the superior aspect of the right breast was opened. The previous mastectomy site was then dissected free along with normal-appearing breast tissue circumferentially down to the chest wall. A short suture was placed superiorly and a long suture placed laterally for orientation purposes. The specimen was removed and sent to pathology for examination. A bleeding was controlled using Bovie electrocautery. The wound was irrigated with normal saline. 0.5% of Sensorcaine was instilled into the surrounding wound. The subcutaneous layer was reapproximated using a 3-0 Vicryl interrupted suture. The skin was closed using a 4-0 Vicryl subcuticular suture. Dermabond was applied.  All tape and needle counts were correct at the end of the procedure. The patient was awakened and transferred to PACU in stable condition.  Complications:  None  EBL:  Minimal  Specimen:  Reexcision of right breast tissue

## 2017-05-01 NOTE — Discharge Instructions (Signed)
Partial Mastectomy With or Without Axillary Lymph Node Removal, Care After °Refer to this sheet in the next few weeks. These instructions provide you with information about caring for yourself after your procedure. Your health care provider may also give you more specific instructions. Your treatment has been planned according to current medical practices, but problems sometimes occur. Call your health care provider if you have any problems or questions after your procedure. °What can I expect after the procedure? °After your procedure, it is common to have: °· Breast swelling. °· Breast tenderness. °· Stiffness in your arm or shoulder. °· A change in the shape and feel of your breast. ° °Follow these instructions at home: °Bathing °· Take sponge baths until your health care provider says that you can start showering or bathing. °· Do not take baths, swim, or use a hot tub until your health care provider approves. °Incision care °· There are many different ways to close and cover an incision, including stitches, skin glue, and adhesive strips. Follow your health care provider's instructions about: °? Incision care. °? Bandage (dressing) changes and removal. °? Incision closure removal. °· Check your incision area every day for signs of infection. Watch for: °? Redness, swelling, or pain. °? Fluid, blood, or pus. °· If you were sent home with a surgical drain in place, follow your health care provider's instructions for emptying it. °Activity °· Return to your normal activities as directed by your health care provider. °· Avoid strenuous exercise. °· Be careful to avoid any activities that could cause an injury to your arm on the side of your surgery. °· Do not lift anything that is heavier than 10 lb (4.5 kg). Avoid lifting with the arm that is on the side of your surgery. °· Do not carry heavy objects on your shoulder. °· After your drain is removed, you should perform exercises to keep your arm from getting stiff  and swollen. Talk with your health care provider about which exercises are safe for you. °General instructions °· Take medicines only as directed by your health care provider. °· Keep your dressing clean and dry. °· You may eat what you usually do. °· Wear a supportive bra as directed by your health care provider. °· Keep your arm elevated when at rest. °· Do not wear tight jewelry on your arm, wrist, or fingers on the side of your surgery. °· Get checked for extra fluid around your lymph nodes (lymphedema) as often as told by your health care provider. °· If you had any lymph nodes removed during your procedure, be sure to tell all of your health care providers. This is important information to share before you are involved in certain procedures, such as giving blood or having your blood pressure taken. °Contact a health care provider if: °· You have a fever. °· Your pain medicine is not working. °· Your swelling, weakness, or numbness in your arm has not improved after a few weeks. °· You have new swelling in your breast or arm. °· You have redness, swelling, or pain in your incision area. °· You have fluid, blood, or pus coming from your incision. °Get help right away if: °· You have very bad pain in your breast or arm. °· You have chest pain. °· You have difficulty breathing. °This information is not intended to replace advice given to you by your health care provider. Make sure you discuss any questions you have with your health care provider. °Document Released: 03/13/2004 Document   Revised: 04/05/2016 Document Reviewed: 04/14/2014 Elsevier Interactive Patient Education  [employer].

## 2017-05-01 NOTE — Anesthesia Procedure Notes (Signed)
Procedure Name: LMA Insertion Date/Time: 05/01/2017 7:41 AM Performed by: Andree Elk, AMY A Pre-anesthesia Checklist: Patient identified, Emergency Drugs available, Timeout performed, Suction available and Patient being monitored Patient Re-evaluated:Patient Re-evaluated prior to induction Oxygen Delivery Method: Circle system utilized Preoxygenation: Pre-oxygenation with 100% oxygen Induction Type: IV induction Ventilation: Mask ventilation without difficulty LMA: LMA inserted LMA Size: 4.0 Number of attempts: 1 Placement Confirmation: positive ETCO2 and breath sounds checked- equal and bilateral Tube secured with: Tape Dental Injury: Teeth and Oropharynx as per pre-operative assessment

## 2017-05-02 ENCOUNTER — Encounter (HOSPITAL_COMMUNITY): Payer: Self-pay | Admitting: General Surgery

## 2017-05-09 ENCOUNTER — Ambulatory Visit (INDEPENDENT_AMBULATORY_CARE_PROVIDER_SITE_OTHER): Payer: Self-pay | Admitting: General Surgery

## 2017-05-09 ENCOUNTER — Encounter: Payer: Self-pay | Admitting: General Surgery

## 2017-05-09 VITALS — BP 144/70 | HR 80 | Temp 99.1°F | Resp 18 | Ht 66.0 in | Wt 186.0 lb

## 2017-05-09 DIAGNOSIS — Z09 Encounter for follow-up examination after completed treatment for conditions other than malignant neoplasm: Secondary | ICD-10-CM

## 2017-05-09 NOTE — Progress Notes (Signed)
Subjective:     Sara Solomon   status post reexcision of right breast cancer. Incision does not have pain. Patient is complaining of a little swelling of her right forearm that just occurred 2 days ago. She has been using her arm actively in the yard. She states it is somewhat better today.  Objective:    BP (!) 144/70   Pulse 80   Temp 99.1 F (37.3 C)   Resp 18   Ht 5\' 6"  (1.676 m)   Wt 186 lb (84.4 kg)   BMI 30.02 kg/m   General:  alert, cooperative and no distress  Right breast incision healing well. Right forearm with minimal swelling. It is somewhat localized along the volar aspect. No hand swelling is noted. She has full range of motion of her right hand and elbow. Final pathology is negative for malignancy.     Assessment:    Doing well postoperatively.   Her right forearm pain may be secondary to increased activity. She did not have this immediately after her sentinel lymph node dissection.   Plan:  Will refer to oncology for further evaluation and treatment of her right breast cancer. We'll monitor her right forearm discomfort. Patient was instructed to call me should this not resolve in the next few weeks.

## 2017-05-23 ENCOUNTER — Encounter (HOSPITAL_COMMUNITY): Payer: Self-pay | Admitting: Oncology

## 2017-05-23 ENCOUNTER — Encounter (HOSPITAL_COMMUNITY): Payer: Medicare Other | Attending: Oncology | Admitting: Oncology

## 2017-05-23 ENCOUNTER — Encounter (HOSPITAL_COMMUNITY): Payer: Self-pay | Admitting: Lab

## 2017-05-23 VITALS — BP 148/72 | HR 72 | Temp 98.2°F | Resp 18 | Wt 187.5 lb

## 2017-05-23 DIAGNOSIS — Z170 Estrogen receptor positive status [ER+]: Secondary | ICD-10-CM

## 2017-05-23 DIAGNOSIS — C50411 Malignant neoplasm of upper-outer quadrant of right female breast: Secondary | ICD-10-CM | POA: Diagnosis not present

## 2017-05-23 NOTE — Progress Notes (Signed)
[facility] Initial Visit:  Patient Care Team: Glenda Chroman, MD as PCP - General (Internal Medicine)  CHIEF COMPLAINTS/PURPOSE OF CONSULTATION:  Stage IA right breast cancer  HISTORY OF PRESENTING ILLNESS: Sara Solomon 71 y.o. female is here because of IDC of right breast. Patient was initially referred to Dr. Arnoldo Morale for surgical consultation by her PCP, Dr. Woody Seller. She had a negative screening mammogram 1 year ago. She did not feel any lump, skin changes, nipple discharge, or breast pain. No family or personal history of breast cancer/ovarian cancer.  03/06/2017: Bilateral diagnostic mammogram at [facility] demonstrated 7 mm mass in the 2:00 position of the right breast with imaging features highly suspicious for malignancy. Small cyst in the 2:00 and 10:00 position of the right breast.  03/13/2017: Ultrasound-guided biopsy of 2:00 breast mass was performed. 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  04/03/2017: Right partial mastectomy after needle localization sentinel lymph node biopsy by Dr. Arnoldo Morale. Surgical path demonstrated invasive ductal carcinoma, grade 2, spanning 1.9 cm, DCIS is present, margin resections focally positive for carcinoma (2 mm), 0 out of 3 lymph nodes were positive for carcinoma. [id number] ER 100%, PR 100%, HER-2 negative.  05/01/2017: Reexcision of positive margin by Dr. Arnoldo Morale.  Today patient presents for evaluation. She states that overall she has been doing well. She has healed well from surgery except that she has swelling in her right forearm which is a little painful 2 out of 10 in severity. She also has some numbness in her right arm since her surgery. She denies any chest pain, shortness breath, abdominal pain, focal weakness, recent infections. She occasionally has anxiety and sleep problems. She occasionally has leg swelling. She had 1 episode of chills 3 days ago but did not have any other associated symptoms of  cough, congestion, or any other symptoms indicating infection.  Review of Systems - Oncology ROS as per HPI otherwise 12 point ROS is negative.  MEDICAL HISTORY: Past Medical History:  Diagnosis Date  . Anemia   . Anxiety   . Arthritis   . Asthma   . Hypertension   . Hypothyroidism   . Thyroid disease     SURGICAL HISTORY: Past Surgical History:  Procedure Laterality Date  . ABDOMINAL HYSTERECTOMY    . CATARACT EXTRACTION, BILATERAL    . CHOLECYSTECTOMY    . MASTECTOMY, PARTIAL Right 05/01/2017   Procedure: MASTECTOMY PARTIAL;  Surgeon: Aviva Signs, MD;  Location: [facility];  Service: General;  Laterality: Right;  . PARTIAL MASTECTOMY WITH NEEDLE LOCALIZATION AND AXILLARY SENTINEL LYMPH NODE BX Right 04/03/2017   Procedure: PARTIAL MASTECTOMY WITH NEEDLE LOCALIZATION AND AXILLARY SENTINEL LYMPH NODE BX;  Surgeon: Aviva Signs, MD;  Location: [facility];  Service: General;  Laterality: Right;    SOCIAL HISTORY: Social History   Social History  . Marital status: Married    Spouse name: N/A  . Number of children: N/A  . Years of education: N/A   Occupational History  . Not on file.   Social History Main Topics  . Smoking status: Former Research scientist (life sciences)  . Smokeless tobacco: Never Used     Comment: quit smoking in 1998  . Alcohol use No  . Drug use: No  . Sexual activity: Yes   Other Topics Concern  . Not on file   Social History Narrative  . No narrative on file    FAMILY HISTORY No family history on file.  ALLERGIES:  is allergic to other and penicillins.  MEDICATIONS:  Current Outpatient Prescriptions  Medication Sig Dispense Refill  . albuterol (PROVENTIL HFA;VENTOLIN HFA) 108 (90 Base) MCG/ACT inhaler Inhale 1-2 puffs into the lungs every 6 (six) hours as needed for wheezing or shortness of breath.    Marland Kitchen aspirin EC 81 MG tablet Take 81 mg by mouth daily.    . Calcium Carbonate-Vitamin D (CALCIUM 600+D PO) Take 1 tablet by mouth daily.    . ferrous gluconate (FERGON)  324 MG tablet Take 324 mg by mouth daily.    Marland Kitchen HYDROcodone-acetaminophen (NORCO) 5-325 MG tablet Take 1 tablet by mouth every 6 (six) hours as needed for moderate pain. 30 tablet 0  . ibuprofen (ADVIL,MOTRIN) 200 MG tablet Take 800 mg by mouth daily as needed for headache or moderate pain.    Marland Kitchen levothyroxine (SYNTHROID, LEVOTHROID) 100 MCG tablet Take 100 mcg by mouth daily before breakfast.    . lisinopril-hydrochlorothiazide (PRINZIDE,ZESTORETIC) 20-25 MG per tablet Take 1 tablet by mouth daily.    . Omega-3 Fatty Acids (FISH OIL) 1000 MG CAPS Take 1,000 mg by mouth daily.     Marland Kitchen omeprazole (PRILOSEC) 40 MG capsule Take 1 capsule (40 mg total) by mouth daily. 90 capsule 3   No current facility-administered medications for this visit.     PHYSICAL EXAMINATION:  ECOG PERFORMANCE STATUS: 0 - Asymptomatic   Vitals:   05/23/17 0852  BP: (!) 148/72  Pulse: 72  Resp: 18  Temp: 98.2 F (36.8 C)    Filed Weights   05/23/17 0852  Weight: 187 lb 8 oz (85 kg)     Physical Exam Constitutional: Well-developed, well-nourished, and in no distress.   HENT:  Head: Normocephalic and atraumatic.  Mouth/Throat: No oropharyngeal exudate. Mucosa moist. Eyes: Pupils are equal, round, and reactive to light. Conjunctivae are normal. No scleral icterus.  Neck: Normal range of motion. Neck supple. No JVD present.  Cardiovascular: Normal rate, regular rhythm and normal heart sounds.  Exam reveals no gallop and no friction rub.   No murmur heard. Pulmonary/Chest: Effort normal and breath sounds normal. No respiratory distress. No wheezes.No rales.  Abdominal: Soft. Bowel sounds are normal. No distension. There is no tenderness. There is no guarding.  Musculoskeletal: No edema or tenderness.  Lymphadenopathy:    No cervical or supraclavicular adenopathy.  Neurological: Alert and oriented to person, place, and time. No cranial nerve deficit.  Skin: Skin is warm and dry. No rash noted. No erythema. No  pallor.  Psychiatric: Affect and judgment normal. Breast: Left breast without masses, nipple discharge, skin changes or axillary lymphadenopathy. Right breast with incision site at 2 oclock position, well healed, firmness under incision site, likely due to seroma. Right axillary incision site well healed, no palpable axillary lymphadenopathy. No other masses palpated in the right breast, no skin changes or nipple discharge.  LABORATORY DATA: I have personally reviewed the data as listed:  No visits with results within 1 Month(s) from this visit.  Latest known visit with results is:  Hospital Outpatient Visit on 04/01/2017  Component Date Value Ref Range Status  . WBC 04/02/2017 10.1  4.0 - 10.5 K/uL Final  . RBC 04/02/2017 3.69* 3.87 - 5.11 MIL/uL Final  . Hemoglobin 04/02/2017 10.8* 12.0 - 15.0 g/dL Final  . HCT 04/02/2017 33.8* 36.0 - 46.0 % Final  . MCV 04/02/2017 91.6  78.0 - 100.0 fL Final  . MCH 04/02/2017 29.3  26.0 - 34.0 pg Final  . MCHC 04/02/2017 32.0  30.0 - 36.0 g/dL Final  .  RDW 04/02/2017 13.7  11.5 - 15.5 % Final  . Platelets 04/02/2017 414* 150 - 400 K/uL Final  . Neutrophils Relative % 04/02/2017 72  % Final  . Neutro Abs 04/02/2017 7.3  1.7 - 7.7 K/uL Final  . Lymphocytes Relative 04/02/2017 18  % Final  . Lymphs Abs 04/02/2017 1.8  0.7 - 4.0 K/uL Final  . Monocytes Relative 04/02/2017 5  % Final  . Monocytes Absolute 04/02/2017 0.5  0.1 - 1.0 K/uL Final  . Eosinophils Relative 04/02/2017 5  % Final  . Eosinophils Absolute 04/02/2017 0.5  0.0 - 0.7 K/uL Final  . Basophils Relative 04/02/2017 0  % Final  . Basophils Absolute 04/02/2017 0.0  0.0 - 0.1 K/uL Final  . Sodium 04/02/2017 137  135 - 145 mmol/L Final  . Potassium 04/02/2017 3.8  3.5 - 5.1 mmol/L Final  . Chloride 04/02/2017 101  101 - 111 mmol/L Final  . CO2 04/02/2017 27  22 - 32 mmol/L Final  . Glucose, Bld 04/02/2017 112* 65 - 99 mg/dL Final  . BUN 04/02/2017 16  6 - 20 mg/dL Final  . Creatinine, Ser  04/02/2017 1.15* 0.44 - 1.00 mg/dL Final  . Calcium 04/02/2017 9.1  8.9 - 10.3 mg/dL Final  . GFR calc non Af Amer 04/02/2017 47* >60 mL/min Final  . GFR calc Af Amer 04/02/2017 55* >60 mL/min Final   Comment: (NOTE) The eGFR has been calculated using the CKD EPI equation. This calculation has not been validated in all clinical situations. eGFR's persistently <60 mL/min signify possible Chronic Kidney Disease.   . Anion gap 04/02/2017 9  5 - 15 Final    RADIOGRAPHIC STUDIES: I have personally reviewed the radiological images as listed and agree with the findings in the report  No results found.  ASSESSMENT/PLAN Cancer Staging Malignant neoplasm of upper-outer quadrant of female breast ([place]) Staging form: Breast, AJCC 8th Edition - Clinical: No stage assigned - Unsigned - Pathologic: Stage IA (pT1c, pN0(sn), cM0, G2, ER: Positive, PR: Positive, HER2: Negative) - Signed by Twana First, MD on 05/23/2017  -Will send out for oncotype Dx to see if she needs adjuvant chemo, although low likelihood. -Refer to radiation oncology for adjuvant radiation evaluation. -Discussed adjuvant endocrine therapy for at least 5 years with the patient. -RTC in 4 weeks for follow up.  All questions were answered. The patient knows to call the clinic with any problems, questions or concerns.  This note was electronically signed.    Twana First, MD  05/23/2017 8:54 AM

## 2017-05-23 NOTE — Progress Notes (Signed)
Called pt to introduce myself.  Explained why I wanted to refer her to OT.  Explained it is good to go at least one time to get education on exercise and p[preventative measure to help prevent anything that could happen in the future.  She is having only a little pain in forearm.  She is willing to come to the rehab dept to be seen.

## 2017-05-23 NOTE — Addendum Note (Signed)
Addended by: Joanne Gavel T on: 05/23/2017 01:26 PM   Modules accepted: Orders

## 2017-05-23 NOTE — Progress Notes (Signed)
oncotype sent and faxed to pathology.  Faxes confirmed.

## 2017-05-23 NOTE — Progress Notes (Unsigned)
Referral to [facility]. Records faxed on 10/11

## 2017-06-17 ENCOUNTER — Encounter (HOSPITAL_COMMUNITY): Payer: Self-pay

## 2017-06-24 ENCOUNTER — Other Ambulatory Visit: Payer: Self-pay

## 2017-06-24 ENCOUNTER — Encounter (HOSPITAL_COMMUNITY): Payer: Self-pay

## 2017-06-24 ENCOUNTER — Encounter (HOSPITAL_COMMUNITY): Payer: Medicare Other | Attending: Oncology | Admitting: Oncology

## 2017-06-24 VITALS — BP 133/74 | HR 84 | Temp 98.2°F | Resp 16 | Ht 65.5 in | Wt 183.0 lb

## 2017-06-24 DIAGNOSIS — Z170 Estrogen receptor positive status [ER+]: Secondary | ICD-10-CM | POA: Diagnosis not present

## 2017-06-24 DIAGNOSIS — C50411 Malignant neoplasm of upper-outer quadrant of right female breast: Secondary | ICD-10-CM

## 2017-06-24 NOTE — Progress Notes (Signed)
[facility] Initial Visit:  Patient Care Team: Glenda Chroman, MD as PCP - General (Internal Medicine)  CHIEF COMPLAINTS/PURPOSE OF CONSULTATION:  Stage IA right breast cancer  HISTORY OF PRESENTING ILLNESS: Sara Solomon 71 y.o. female is here because of IDC of right breast. Patient was initially referred to Dr. Arnoldo Morale for surgical consultation by her PCP, Dr. Woody Seller. She had a negative screening mammogram 1 year ago. She did not feel any lump, skin changes, nipple discharge, or breast pain. No family or personal history of breast cancer/ovarian cancer.  03/06/2017: Bilateral diagnostic mammogram at [facility] demonstrated 7 mm mass in the 2:00 position of the right breast with imaging features highly suspicious for malignancy. Small cyst in the 2:00 and 10:00 position of the right breast.  03/13/2017: Ultrasound-guided biopsy of 2:00 breast mass was performed. 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  04/03/2017: Right partial mastectomy after needle localization sentinel lymph node biopsy by Dr. Arnoldo Morale. Surgical path demonstrated invasive ductal carcinoma, grade 2, spanning 1.9 cm, DCIS is present, margin resections focally positive for carcinoma (2 mm), 0 out of 3 lymph nodes were positive for carcinoma. pT1cpN0pMx ER 100%, PR 100%, HER-2 negative.  05/01/2017: Reexcision of positive margin by Dr. Arnoldo Morale.  Patient presents today for continue follow-up.  She states she has been doing well except that she is currently being treated for diverticulitis.  She had abdominal pain with nausea vomiting, denied any diarrhea.  She is currently on 2 antibiotics for treatment of her diverticulitis and will complete her 10-day treatment later this week.  Otherwise she denies any chest pain, shortness breath, abdominal pain, focal weakness, headache.  Review of Systems - Oncology ROS as per HPI otherwise 12 point ROS is negative.  MEDICAL HISTORY: Past Medical  History:  Diagnosis Date  . Anemia   . Anxiety   . Arthritis   . Asthma   . Hypertension   . Hypothyroidism   . Thyroid disease     SURGICAL HISTORY: Past Surgical History:  Procedure Laterality Date  . ABDOMINAL HYSTERECTOMY    . CATARACT EXTRACTION, BILATERAL    . CHOLECYSTECTOMY      SOCIAL HISTORY: Social History   Socioeconomic History  . Marital status: Married    Spouse name: Not on file  . Number of children: Not on file  . Years of education: Not on file  . Highest education level: Not on file  Social Needs  . Financial resource strain: Not on file  . Food insecurity - worry: Not on file  . Food insecurity - inability: Not on file  . Transportation needs - medical: Not on file  . Transportation needs - non-medical: Not on file  Occupational History  . Not on file  Tobacco Use  . Smoking status: Former Research scientist (life sciences)  . Smokeless tobacco: Never Used  . Tobacco comment: quit smoking in 1998  Substance and Sexual Activity  . Alcohol use: No    Alcohol/week: 0.0 oz  . Drug use: No  . Sexual activity: Yes  Other Topics Concern  . Not on file  Social History Narrative  . Not on file    FAMILY HISTORY History reviewed. No pertinent family history.  ALLERGIES:  is allergic to other and penicillins.  MEDICATIONS:  Current Outpatient Medications  Medication Sig Dispense Refill  . albuterol (PROVENTIL HFA;VENTOLIN HFA) 108 (90 Base) MCG/ACT inhaler Inhale 1-2 puffs into the lungs every 6 (six) hours as needed for wheezing or shortness of  breath.    Marland Kitchen aspirin EC 81 MG tablet Take 81 mg by mouth daily.    . Calcium Carb-Cholecalciferol (CALCIUM PLUS VITAMIN D3) 600-500 MG-UNIT CAPS Take by mouth.    . Calcium Carbonate-Vitamin D (CALCIUM 600+D PO) Take 1 tablet by mouth daily.    . ciprofloxacin (CIPRO) 500 MG tablet     . ferrous gluconate (FERGON) 324 MG tablet Take 324 mg by mouth daily.    Marland Kitchen HYDROcodone-acetaminophen (NORCO) 5-325 MG tablet Take 1 tablet by  mouth every 6 (six) hours as needed for moderate pain. 30 tablet 0  . ibuprofen (ADVIL,MOTRIN) 200 MG tablet Take 800 mg by mouth daily as needed for headache or moderate pain.    Marland Kitchen levothyroxine (SYNTHROID, LEVOTHROID) 100 MCG tablet Take 100 mcg by mouth daily before breakfast.    . lisinopril-hydrochlorothiazide (PRINZIDE,ZESTORETIC) 20-25 MG per tablet Take 1 tablet by mouth daily.    . metroNIDAZOLE (FLAGYL) 500 MG tablet     . omega-3 acid ethyl esters (LOVAZA) 1 g capsule Take 1,000 mg by mouth.    . Omega-3 Fatty Acids (FISH OIL) 1000 MG CAPS Take 1,000 mg by mouth daily.     Marland Kitchen omeprazole (PRILOSEC) 40 MG capsule Take 1 capsule (40 mg total) by mouth daily. 90 capsule 3  . promethazine (PHENERGAN) 25 MG tablet Take 25 mg every 6 (six) hours as needed by mouth for nausea or vomiting.    Marland Kitchen oxyCODONE-acetaminophen (PERCOCET/ROXICET) 5-325 MG tablet      No current facility-administered medications for this visit.     PHYSICAL EXAMINATION:  ECOG PERFORMANCE STATUS: 0 - Asymptomatic   Vitals:   06/24/17 1031  BP: 133/74  Pulse: 84  Resp: 16  Temp: 98.2 F (36.8 C)  SpO2: 98%    Filed Weights   06/24/17 1031  Weight: 183 lb (83 kg)     Physical Exam Constitutional: Well-developed, well-nourished, and in no distress.   HENT:  Head: Normocephalic and atraumatic.  Mouth/Throat: No oropharyngeal exudate. Mucosa moist. Eyes: Pupils are equal, round, and reactive to light. Conjunctivae are normal. No scleral icterus.  Neck: Normal range of motion. Neck supple. No JVD present.  Cardiovascular: Normal rate, regular rhythm and normal heart sounds.  Exam reveals no gallop and no friction rub.   No murmur heard. Pulmonary/Chest: Effort normal and breath sounds normal. No respiratory distress. No wheezes.No rales.  Abdominal: Soft. Bowel sounds are normal. No distension. There is no tenderness. There is no guarding.  Musculoskeletal: No edema or tenderness.  Lymphadenopathy:     No cervical or supraclavicular adenopathy.  Neurological: Alert and oriented to person, place, and time. No cranial nerve deficit.  Skin: Skin is warm and dry. No rash noted. No erythema. No pallor.  Psychiatric: Affect and judgment normal.  LABORATORY DATA: I have personally reviewed the data as listed:  No visits with results within 1 Month(s) from this visit.  Latest known visit with results is:  Hospital Outpatient Visit on 04/01/2017  Component Date Value Ref Range Status  . WBC 04/02/2017 10.1  4.0 - 10.5 K/uL Final  . RBC 04/02/2017 3.69* 3.87 - 5.11 MIL/uL Final  . Hemoglobin 04/02/2017 10.8* 12.0 - 15.0 g/dL Final  . HCT 04/02/2017 33.8* 36.0 - 46.0 % Final  . MCV 04/02/2017 91.6  78.0 - 100.0 fL Final  . MCH 04/02/2017 29.3  26.0 - 34.0 pg Final  . MCHC 04/02/2017 32.0  30.0 - 36.0 g/dL Final  . RDW 04/02/2017 13.7  11.5 -  15.5 % Final  . Platelets 04/02/2017 414* 150 - 400 K/uL Final  . Neutrophils Relative % 04/02/2017 72  % Final  . Neutro Abs 04/02/2017 7.3  1.7 - 7.7 K/uL Final  . Lymphocytes Relative 04/02/2017 18  % Final  . Lymphs Abs 04/02/2017 1.8  0.7 - 4.0 K/uL Final  . Monocytes Relative 04/02/2017 5  % Final  . Monocytes Absolute 04/02/2017 0.5  0.1 - 1.0 K/uL Final  . Eosinophils Relative 04/02/2017 5  % Final  . Eosinophils Absolute 04/02/2017 0.5  0.0 - 0.7 K/uL Final  . Basophils Relative 04/02/2017 0  % Final  . Basophils Absolute 04/02/2017 0.0  0.0 - 0.1 K/uL Final  . Sodium 04/02/2017 137  135 - 145 mmol/L Final  . Potassium 04/02/2017 3.8  3.5 - 5.1 mmol/L Final  . Chloride 04/02/2017 101  101 - 111 mmol/L Final  . CO2 04/02/2017 27  22 - 32 mmol/L Final  . Glucose, Bld 04/02/2017 112* 65 - 99 mg/dL Final  . BUN 04/02/2017 16  6 - 20 mg/dL Final  . Creatinine, Ser 04/02/2017 1.15* 0.44 - 1.00 mg/dL Final  . Calcium 04/02/2017 9.1  8.9 - 10.3 mg/dL Final  . GFR calc non Af Amer 04/02/2017 47* >60 mL/min Final  . GFR calc Af Amer 04/02/2017 55*  >60 mL/min Final   Comment: (NOTE) The eGFR has been calculated using the CKD EPI equation. This calculation has not been validated in all clinical situations. eGFR's persistently <60 mL/min signify possible Chronic Kidney Disease.   . Anion gap 04/02/2017 9  5 - 15 Final    RADIOGRAPHIC STUDIES: I have personally reviewed the radiological images as listed and agree with the findings in the report  No results found.  ASSESSMENT/PLAN Cancer Staging Malignant neoplasm of upper-outer quadrant of female breast ([place]) Staging form: Breast, AJCC 8th Edition - Clinical: No stage assigned - Unsigned - Pathologic: Stage IA (pT1c, pN0(sn), cM0, G2, ER: Positive, PR: Positive, HER2: Negative) - Signed by Twana First, MD on 05/23/2017  -Oncotype Dx reviewed with the patient. Her recurrence score is 9, therefore she will NOT benefit from adjuvant chemo. -She has completed simulation for adjuvant radiation but does not know her start date yet. -Discussed adjuvant endocrine therapy for at least 5 years with the patient. -RTC in 6 weeks for follow up to discuss initiating endocrine therapy.  All questions were answered. The patient knows to call the clinic with any problems, questions or concerns.  This note was electronically signed.    Twana First, MD  06/24/2017 1:50 PM

## 2017-08-14 ENCOUNTER — Inpatient Hospital Stay (HOSPITAL_COMMUNITY): Payer: Medicare Other | Attending: Hematology and Oncology | Admitting: Oncology

## 2017-08-14 ENCOUNTER — Other Ambulatory Visit: Payer: Self-pay

## 2017-08-14 ENCOUNTER — Encounter (HOSPITAL_COMMUNITY): Payer: Self-pay | Admitting: Oncology

## 2017-08-14 VITALS — BP 153/78 | HR 69 | Temp 98.3°F | Resp 18 | Wt 183.0 lb

## 2017-08-14 DIAGNOSIS — C50411 Malignant neoplasm of upper-outer quadrant of right female breast: Secondary | ICD-10-CM | POA: Diagnosis not present

## 2017-08-14 DIAGNOSIS — Z170 Estrogen receptor positive status [ER+]: Secondary | ICD-10-CM | POA: Diagnosis not present

## 2017-08-14 DIAGNOSIS — Z79811 Long term (current) use of aromatase inhibitors: Secondary | ICD-10-CM

## 2017-08-14 DIAGNOSIS — Z923 Personal history of irradiation: Secondary | ICD-10-CM

## 2017-08-14 DIAGNOSIS — Z780 Asymptomatic menopausal state: Secondary | ICD-10-CM | POA: Diagnosis not present

## 2017-08-14 DIAGNOSIS — L598 Other specified disorders of the skin and subcutaneous tissue related to radiation: Secondary | ICD-10-CM

## 2017-08-14 MED ORDER — LETROZOLE 2.5 MG PO TABS: 2.5000 mg | ORAL_TABLET | Freq: Every day | ORAL | 0 refills | Status: DC

## 2017-08-14 NOTE — Progress Notes (Signed)
[facility] Initial Visit:  Patient Care Team: Glenda Chroman, MD as PCP - General (Internal Medicine)  CHIEF COMPLAINTS/PURPOSE OF CONSULTATION:  Stage IA right breast cancer  HISTORY OF PRESENTING ILLNESS: Sara Solomon 72 y.o. female is here because of IDC of right breast. Patient was initially referred to Dr. Arnoldo Morale for surgical consultation by her PCP, Dr. Woody Seller. She had a negative screening mammogram 1 year ago. She did not feel any lump, skin changes, nipple discharge, or breast pain. No family or personal history of breast cancer/ovarian cancer.  03/06/2017: Bilateral diagnostic mammogram at [facility] demonstrated 7 mm mass in the 2:00 position of the right breast with imaging features highly suspicious for malignancy. Small cyst in the 2:00 and 10:00 position of the right breast.  03/13/2017: Ultrasound-guided biopsy of 2:00 breast mass was performed. 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  04/03/2017: Right partial mastectomy after needle localization sentinel lymph node biopsy by Dr. Arnoldo Morale. Surgical path demonstrated invasive ductal carcinoma, grade 2, spanning 1.9 cm, DCIS is present, margin resections focally positive for carcinoma (2 mm), 0 out of 3 lymph nodes were positive for carcinoma. pT1cpN0pMx ER 100%, PR 100%, HER-2 negative.  05/01/2017: Reexcision of positive margin by Dr. Arnoldo Morale.  Patient presents today for continue follow-up.  Patient states she is currently doing well and recently finished radiation last week.  She was treated for diverticulitis approximately 1 month ago and is feeling much better denying any further pain or diarrhea.  She complains of a itching rash at radiation site.  She was given clobetasol cream and Aquaphor gel.  Otherwise she feels well.  She denies chest pain, shortness of breath, abdominal pain, focal weakness or headache.  Review of Systems  Constitutional: Negative for appetite change, chills,  fatigue and fever.  HENT:  Negative.   Eyes: Negative.   Respiratory: Negative.   Cardiovascular: Negative.  Negative for chest pain and leg swelling.  Gastrointestinal: Negative.   Endocrine: Negative.   Genitourinary: Negative.    Musculoskeletal: Negative.   Skin: Positive for itching and rash.       From radiation to right upper quadrant of right breast.  Neurological: Negative.   Hematological: Negative.   Psychiatric/Behavioral: Negative.    ROS as per HPI otherwise 12 point ROS is negative.  MEDICAL HISTORY: Past Medical History:  Diagnosis Date  . Anemia   . Anxiety   . Arthritis   . Asthma   . Hypertension   . Hypothyroidism   . Thyroid disease     SURGICAL HISTORY: Past Surgical History:  Procedure Laterality Date  . ABDOMINAL HYSTERECTOMY    . CATARACT EXTRACTION, BILATERAL    . CHOLECYSTECTOMY    . MASTECTOMY, PARTIAL Right 05/01/2017   Procedure: MASTECTOMY PARTIAL;  Surgeon: Aviva Signs, MD;  Location: [facility];  Service: General;  Laterality: Right;  . PARTIAL MASTECTOMY WITH NEEDLE LOCALIZATION AND AXILLARY SENTINEL LYMPH NODE BX Right 04/03/2017   Procedure: PARTIAL MASTECTOMY WITH NEEDLE LOCALIZATION AND AXILLARY SENTINEL LYMPH NODE BX;  Surgeon: Aviva Signs, MD;  Location: [facility];  Service: General;  Laterality: Right;    SOCIAL HISTORY: Social History   Socioeconomic History  . Marital status: Married    Spouse name: Not on file  . Number of children: Not on file  . Years of education: Not on file  . Highest education level: Not on file  Social Needs  . Financial resource strain: Not on file  . Food insecurity -  worry: Not on file  . Food insecurity - inability: Not on file  . Transportation needs - medical: Not on file  . Transportation needs - non-medical: Not on file  Occupational History  . Not on file  Tobacco Use  . Smoking status: Former Research scientist (life sciences)  . Smokeless tobacco: Never Used  . Tobacco comment: quit smoking in 1998  Substance  and Sexual Activity  . Alcohol use: No    Alcohol/week: 0.0 oz  . Drug use: No  . Sexual activity: Yes  Other Topics Concern  . Not on file  Social History Narrative  . Not on file    FAMILY HISTORY History reviewed. No pertinent family history.  ALLERGIES:  is allergic to other and penicillins.  MEDICATIONS:  Current Outpatient Medications  Medication Sig Dispense Refill  . albuterol (PROVENTIL HFA;VENTOLIN HFA) 108 (90 Base) MCG/ACT inhaler Inhale 1-2 puffs into the lungs every 6 (six) hours as needed for wheezing or shortness of breath.    Marland Kitchen aspirin EC 81 MG tablet Take 81 mg by mouth daily.    . Calcium Carb-Cholecalciferol (CALCIUM PLUS VITAMIN D3) 600-500 MG-UNIT CAPS Take by mouth.    . Calcium Carbonate-Vitamin D (CALCIUM 600+D PO) Take 1 tablet by mouth daily.    . ferrous gluconate (FERGON) 324 MG tablet Take 324 mg by mouth daily.    Marland Kitchen ibuprofen (ADVIL,MOTRIN) 200 MG tablet Take 800 mg by mouth daily as needed for headache or moderate pain.    Marland Kitchen levothyroxine (SYNTHROID, LEVOTHROID) 100 MCG tablet Take 100 mcg by mouth daily before breakfast.    . lisinopril-hydrochlorothiazide (PRINZIDE,ZESTORETIC) 20-25 MG per tablet Take 1 tablet by mouth daily.    Marland Kitchen omega-3 acid ethyl esters (LOVAZA) 1 g capsule Take 1,000 mg by mouth.    . Omega-3 Fatty Acids (FISH OIL) 1000 MG CAPS Take 1,000 mg by mouth daily.     Marland Kitchen omeprazole (PRILOSEC) 40 MG capsule Take 1 capsule (40 mg total) by mouth daily. 90 capsule 3  . promethazine (PHENERGAN) 25 MG tablet Take 25 mg every 6 (six) hours as needed by mouth for nausea or vomiting.    Marland Kitchen letrozole (FEMARA) 2.5 MG tablet Take 1 tablet (2.5 mg total) by mouth daily. 90 tablet 0   No current facility-administered medications for this visit.     PHYSICAL EXAMINATION:  ECOG PERFORMANCE STATUS: 0 - Asymptomatic   Vitals:   08/14/17 1105  BP: (!) 153/78  Pulse: 69  Resp: 18  Temp: 98.3 F (36.8 C)  SpO2: 99%    Filed Weights    08/14/17 1105  Weight: 183 lb (83 kg)     Physical Exam  Constitutional: She is oriented to person, place, and time and well-developed, well-nourished, and in no distress.  HENT:  Head: Normocephalic and atraumatic.  Eyes: Pupils are equal, round, and reactive to light.  Neck: Normal range of motion. Neck supple.  Cardiovascular: Normal rate and regular rhythm.  Pulmonary/Chest: Effort normal and breath sounds normal.  Abdominal: Soft. Bowel sounds are normal.  Musculoskeletal: Normal range of motion.  Neurological: She is alert and oriented to person, place, and time.  Skin: Skin is warm and dry. Rash noted.      Constitutional: Well-developed, well-nourished, and in no distress.   HENT:  Head: Normocephalic and atraumatic.  Mouth/Throat: No oropharyngeal exudate. Mucosa moist. Eyes: Pupils are equal, round, and reactive to light. Conjunctivae are normal. No scleral icterus.  Neck: Normal range of motion. Neck supple. No JVD present.  Cardiovascular: Normal rate, regular rhythm and normal heart sounds.  Exam reveals no gallop and no friction rub. No murmur heard. Pulmonary/Chest: Effort normal and breath sounds normal. No respiratory distress. No wheezes.No rales.  Abdominal: Soft. Bowel sounds are normal. No distension. There is no tenderness. There is no guarding.  Musculoskeletal: No edema or tenderness.  Lymphadenopathy: No cervical or supraclavicular adenopathy.  Neurological: Alert and oriented to person, place, and time. No cranial nerve deficit.  Skin: Skin is warm and dry. No rash noted. No erythema. No pallor.  Psychiatric: Affect and judgment normal.  LABORATORY DATA: I have personally reviewed the data as listed:  No visits with results within 1 Month(s) from this visit.  Latest known visit with results is:  Hospital Outpatient Visit on 04/01/2017  Component Date Value Ref Range Status  . WBC 04/02/2017 10.1  4.0 - 10.5 K/uL Final  . RBC 04/02/2017 3.69* 3.87  - 5.11 MIL/uL Final  . Hemoglobin 04/02/2017 10.8* 12.0 - 15.0 g/dL Final  . HCT 04/02/2017 33.8* 36.0 - 46.0 % Final  . MCV 04/02/2017 91.6  78.0 - 100.0 fL Final  . MCH 04/02/2017 29.3  26.0 - 34.0 pg Final  . MCHC 04/02/2017 32.0  30.0 - 36.0 g/dL Final  . RDW 04/02/2017 13.7  11.5 - 15.5 % Final  . Platelets 04/02/2017 414* 150 - 400 K/uL Final  . Neutrophils Relative % 04/02/2017 72  % Final  . Neutro Abs 04/02/2017 7.3  1.7 - 7.7 K/uL Final  . Lymphocytes Relative 04/02/2017 18  % Final  . Lymphs Abs 04/02/2017 1.8  0.7 - 4.0 K/uL Final  . Monocytes Relative 04/02/2017 5  % Final  . Monocytes Absolute 04/02/2017 0.5  0.1 - 1.0 K/uL Final  . Eosinophils Relative 04/02/2017 5  % Final  . Eosinophils Absolute 04/02/2017 0.5  0.0 - 0.7 K/uL Final  . Basophils Relative 04/02/2017 0  % Final  . Basophils Absolute 04/02/2017 0.0  0.0 - 0.1 K/uL Final  . Sodium 04/02/2017 137  135 - 145 mmol/L Final  . Potassium 04/02/2017 3.8  3.5 - 5.1 mmol/L Final  . Chloride 04/02/2017 101  101 - 111 mmol/L Final  . CO2 04/02/2017 27  22 - 32 mmol/L Final  . Glucose, Bld 04/02/2017 112* 65 - 99 mg/dL Final  . BUN 04/02/2017 16  6 - 20 mg/dL Final  . Creatinine, Ser 04/02/2017 1.15* 0.44 - 1.00 mg/dL Final  . Calcium 04/02/2017 9.1  8.9 - 10.3 mg/dL Final  . GFR calc non Af Amer 04/02/2017 47* >60 mL/min Final  . GFR calc Af Amer 04/02/2017 55* >60 mL/min Final   Comment: (NOTE) The eGFR has been calculated using the CKD EPI equation. This calculation has not been validated in all clinical situations. eGFR's persistently <60 mL/min signify possible Chronic Kidney Disease.   . Anion gap 04/02/2017 9  5 - 15 Final    RADIOGRAPHIC STUDIES: I have personally reviewed the radiological images as listed and agree with the findings in the report  No results found.  ASSESSMENT/PLAN Cancer Staging Malignant neoplasm of upper-outer quadrant of female breast ([place]) Staging form: Breast, AJCC 8th  Edition - Clinical: No stage assigned - Unsigned - Pathologic: Stage IA (pT1c, pN0(sn), cM0, G2, ER: Positive, PR: Positive, HER2: Negative) - Signed by Twana First, MD on 05/23/2017  -Oncotype Dx reviewed with the patient. Her recurrence score is 9, therefore she will NOT benefit from adjuvant chemo. -Patient recently finished radiation last week with  Dr. Lisbeth Renshaw.  Patient states it went well.  Patient does have radiation dermatitis.  Continue clobetasol cream and Aquaphor as prescribed. -Patient to start endocrine therapy today.  Spoke with supervising physician Dr. Grayland Ormond and it is recommended for patient to begin 2.5 mg anastrozole daily for 5-10 years.  Patient is postmenopausal and hormone receptor positive.   -Bone density: Patient states she recently had bone density scan by PCP.  Unfortunately they are not in the media files.  We will try and obtain bone density scan.  Per patient it was normal.  Repeat DEXA in approximately 2 years if not sooner if unable to locate most recent Dexa.  - Mammogram: Repeat in one year. Orders placed. Last mammogram was completed in August 2018 by [facility]  -RTC in 3 months to assess tolerance of AI.  Patient encouraged to call if unable to tolerate medication.  Patient education provided regarding side effects of medication.  All questions were answered. The patient knows to call the clinic with any problems, questions or concerns.  This note was electronically signed.    Jacquelin Hawking, NP  08/15/2017 1:04 PM

## 2017-08-16 ENCOUNTER — Other Ambulatory Visit: Payer: Self-pay | Admitting: Oncology

## 2017-08-16 DIAGNOSIS — C50411 Malignant neoplasm of upper-outer quadrant of right female breast: Secondary | ICD-10-CM

## 2017-08-29 ENCOUNTER — Telehealth (HOSPITAL_COMMUNITY): Payer: Self-pay

## 2017-08-29 ENCOUNTER — Ambulatory Visit (HOSPITAL_COMMUNITY)
Admission: RE | Admit: 2017-08-29 | Discharge: 2017-08-29 | Disposition: A | Discharge status: Home / Self Care | Payer: Medicare Other | Source: Ambulatory Visit | Attending: Oncology | Admitting: Oncology

## 2017-08-29 DIAGNOSIS — C50411 Malignant neoplasm of upper-outer quadrant of right female breast: Secondary | ICD-10-CM

## 2017-08-29 DIAGNOSIS — M85852 Other specified disorders of bone density and structure, left thigh: Secondary | ICD-10-CM | POA: Insufficient documentation

## 2017-08-29 NOTE — Progress Notes (Signed)
Patient needs to start taking Calcium and Vitamin D if she is not already. Per Dexa scan she is osteopenic with a T score of -1.2.   Faythe Casa, NP 08/29/2017 1:06 PM

## 2017-08-29 NOTE — Telephone Encounter (Signed)
-----   Message from Jacquelin Hawking, NP sent at 08/29/2017  1:07 PM EST ----- Would you mind letting this patient know she needs to start taking calcium and vitamin D if she is not already? Her Dexa showed she has osteopenia with a T score -1.2 and she just started an aromatase inhibitor. We will continue to monitor her.   Thanks,  Rulon Abide, NP

## 2017-08-29 NOTE — Telephone Encounter (Signed)
Called and left patient a voicemail about the results of her scan and the recommendations provided by NP. Call cancer center if any questions.

## 2017-11-12 ENCOUNTER — Inpatient Hospital Stay (HOSPITAL_COMMUNITY): Payer: Medicare Other | Attending: Hematology and Oncology | Admitting: Internal Medicine

## 2017-11-12 ENCOUNTER — Encounter (HOSPITAL_COMMUNITY): Payer: Self-pay | Admitting: Internal Medicine

## 2017-11-12 VITALS — BP 130/58 | HR 68 | Temp 97.9°F | Resp 18 | Wt 187.2 lb

## 2017-11-12 DIAGNOSIS — Z79811 Long term (current) use of aromatase inhibitors: Secondary | ICD-10-CM | POA: Diagnosis not present

## 2017-11-12 DIAGNOSIS — C50111 Malignant neoplasm of central portion of right female breast: Secondary | ICD-10-CM

## 2017-11-12 DIAGNOSIS — M8580 Other specified disorders of bone density and structure, unspecified site: Secondary | ICD-10-CM | POA: Insufficient documentation

## 2017-11-12 DIAGNOSIS — E039 Hypothyroidism, unspecified: Secondary | ICD-10-CM | POA: Diagnosis not present

## 2017-11-12 DIAGNOSIS — I10 Essential (primary) hypertension: Secondary | ICD-10-CM | POA: Diagnosis not present

## 2017-11-12 DIAGNOSIS — Z923 Personal history of irradiation: Secondary | ICD-10-CM | POA: Insufficient documentation

## 2017-11-12 DIAGNOSIS — Z170 Estrogen receptor positive status [ER+]: Secondary | ICD-10-CM | POA: Diagnosis not present

## 2017-11-12 MED ORDER — LETROZOLE 2.5 MG PO TABS: 2.5000 mg | ORAL_TABLET | Freq: Every day | ORAL | 1 refills | Status: DC

## 2017-11-12 NOTE — Progress Notes (Signed)
Diagnosis Malignant neoplasm of central portion of right breast in female, estrogen receptor positive ([place]) - Plan: CBC with Differential/Platelet, Comprehensive metabolic panel, Lactate dehydrogenase  Staging Cancer Staging Malignant neoplasm of upper-outer quadrant of female breast ([place]) Staging form: Breast, AJCC 8th Edition - Clinical: No stage assigned - Unsigned - Pathologic: Stage IA (pT1c, pN0(sn), cM0, G2, ER: Positive, PR: Positive, HER2: Negative) - Signed by Twana First, MD on 05/23/2017   Assessment and Plan: 1.  Stage 1A right breast cancer ER positive PR positive HER-2 negative.  She is status post right lumpectomy 04/03/2017.  She had reexcision of the positive margin 05/01/2017.  She had Oncotype evaluation performed which showed a recurrence score of 9 so she was not recommended for adjuvant chemotherapy.  She received adjuvant radiation in [place] and reportedly completed radiation in December 2018.  She has been started on Femara for adjuvant therapy in January 2019.  She needs refills on the medication so prescription is sent.  She reports she is already scheduled for her mammogram by the physicians in the [place].   She will return to clinic in June or July for follow-up and lab evaluation.    2.  Osteopenia.  This was noted on bone density that was ordered by nurse practitioner Burns and was done on 08/29/2017.  She reports she was told her bone density was done earlier than it should have been.  She had previously was given recommendations concerning calcium and vitamin D.  Continue calcium and vitamin D as recommended.  She should have repeat bone density in January 2021.  3.  Diverticulitis.  She reports a recent bout and has completed antibiotics.  She reports abdominal symptoms have improved.  She should continue to follow with GI as recommended.  4.  Hypertension.  Pressure is 130/58.  Follow-up with PCP.  5.  Hypothyroidism.  Patient is on Synthroid.  Follow-up with  PCP.  Interval history:  72 y.o. female is here because of IDC of right breast. Patient was initially referred to Dr. Arnoldo Morale for surgical consultation by her PCP, Dr. Woody Seller. She had a negative screening mammogram 1 year ago. She did not feel any lump, skin changes, nipple discharge, or breast pain. No family or personal history of breast cancer/ovarian cancer.  03/06/2017: Bilateral diagnostic mammogram at [facility] demonstrated 7 mm mass in the 2:00 position of the right breast with imaging features highly suspicious for malignancy. Small cyst in the 2:00 and 10:00 position of the right breast.  03/13/2017: Ultrasound-guided biopsy of 2:00 breast mass was performed. 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  04/03/2017: Right partial mastectomy after needle localization sentinel lymph node biopsy by Dr. Arnoldo Morale. Surgical path demonstrated invasive ductal carcinoma, grade 2, spanning 1.9 cm, DCIS is present, margin resections focally positive for carcinoma (2 mm), 0 out of 3 lymph nodes were positive for carcinoma. pT1cpN0pMx ER 100%, PR 100%, HER-2 negative.  05/01/2017: Reexcision of positive margin by Dr. Arnoldo Morale.  Pt was previously followed by Dr. Talbert Cage.  Oncotype Dx showed a recurrence score is 9, so she was not recommended for adjuvant chemo. She was seen for adjuvant radiation in [place]   She was also recommended for adjuvant endocrine therapy with Femara which was started in 08/2017.   She completed RT in 07/2017 and reports Ledell Noss already has her mammogram scheduled.      Interval History:  Stage IA (pT1c, pN0(sn), cM0, G2, ER: Positive, PR: Positive, HER2: Negative).  On 04/03/2017 pt had Right partial  mastectomy after needle localization sentinel lymph node biopsy by Dr. Arnoldo Morale. Surgical path demonstrated invasive ductal carcinoma, grade 2, spanning 1.9 cm, DCIS is present, margin resections focally positive for carcinoma (2 mm), 0 out of 3 lymph nodes were positive for  carcinoma. pT1cpN0pMx ER 100%, PR 100%, HER-2 negative..    Current Status: Pt is seen today for follow-up.  She reports she completed radiation in December 2018.  She had a recent bone density evaluation done.  She reports she had a recent bout with diverticulitis and has completed antibiotics.  Problem List Patient Active Problem List   Diagnosis Date Noted  . Malignant neoplasm of central portion of right female breast ([place]) [C50.111]   . Malignant neoplasm of upper-outer quadrant of female breast ([place]) [C50.419]   . Hypothyroidism [E03.9] 09/15/2015  . Essential hypertension [I10] 09/15/2015  . Arthritis [M19.90] 09/15/2015  . Colitis [K52.9] 09/15/2015    Past Medical History Past Medical History:  Diagnosis Date  . Anemia   . Anxiety   . Arthritis   . Asthma   . Hypertension   . Hypothyroidism   . Thyroid disease     Past Surgical History Past Surgical History:  Procedure Laterality Date  . ABDOMINAL HYSTERECTOMY    . CATARACT EXTRACTION, BILATERAL    . CHOLECYSTECTOMY    . MASTECTOMY, PARTIAL Right 05/01/2017   Procedure: MASTECTOMY PARTIAL;  Surgeon: Aviva Signs, MD;  Location: [facility];  Service: General;  Laterality: Right;  . PARTIAL MASTECTOMY WITH NEEDLE LOCALIZATION AND AXILLARY SENTINEL LYMPH NODE BX Right 04/03/2017   Procedure: PARTIAL MASTECTOMY WITH NEEDLE LOCALIZATION AND AXILLARY SENTINEL LYMPH NODE BX;  Surgeon: Aviva Signs, MD;  Location: [facility];  Service: General;  Laterality: Right;    Family History History reviewed. No pertinent family history.   Social History  reports that she has quit smoking. She has never used smokeless tobacco. She reports that she does not drink alcohol or use drugs.  Medications  Current Outpatient Medications:  .  albuterol (PROVENTIL HFA;VENTOLIN HFA) 108 (90 Base) MCG/ACT inhaler, Inhale 1-2 puffs into the lungs every 6 (six) hours as needed for wheezing or shortness of breath., Disp: , Rfl:  .  aspirin EC 81 MG  tablet, Take 81 mg by mouth daily., Disp: , Rfl:  .  Calcium Carb-Cholecalciferol (CALCIUM PLUS VITAMIN D3) 600-500 MG-UNIT CAPS, Take by mouth., Disp: , Rfl:  .  Calcium Carbonate-Vitamin D (CALCIUM 600+D PO), Take 1 tablet by mouth daily., Disp: , Rfl:  .  ferrous gluconate (FERGON) 324 MG tablet, Take 324 mg by mouth daily., Disp: , Rfl:  .  ibuprofen (ADVIL,MOTRIN) 200 MG tablet, Take 800 mg by mouth daily as needed for headache or moderate pain., Disp: , Rfl:  .  letrozole (FEMARA) 2.5 MG tablet, Take 1 tablet (2.5 mg total) by mouth daily., Disp: 90 tablet, Rfl: 1 .  levothyroxine (SYNTHROID, LEVOTHROID) 100 MCG tablet, Take 100 mcg by mouth daily before breakfast., Disp: , Rfl:  .  lisinopril-hydrochlorothiazide (PRINZIDE,ZESTORETIC) 20-25 MG per tablet, Take 1 tablet by mouth daily., Disp: , Rfl:  .  omega-3 acid ethyl esters (LOVAZA) 1 g capsule, Take 1,000 mg by mouth., Disp: , Rfl:  .  Omega-3 Fatty Acids (FISH OIL) 1000 MG CAPS, Take 1,000 mg by mouth daily. , Disp: , Rfl:  .  omeprazole (PRILOSEC) 40 MG capsule, Take 1 capsule (40 mg total) by mouth daily., Disp: 90 capsule, Rfl: 3 .  promethazine (PHENERGAN) 25 MG tablet, Take 25  mg every 6 (six) hours as needed by mouth for nausea or vomiting., Disp: , Rfl:   Allergies Other and Penicillins  Review of Systems Review of Systems - Oncology ROS as per HPI otherwise 12 point ROS is negative.   Physical Exam  Vitals Wt Readings from Last 3 Encounters:  11/12/17 187 lb 3.2 oz (84.9 kg)  08/14/17 183 lb (83 kg)  06/24/17 183 lb (83 kg)   Temp Readings from Last 3 Encounters:  11/12/17 97.9 F (36.6 C) (Oral)  08/14/17 98.3 F (36.8 C) (Oral)  06/24/17 98.2 F (36.8 C) (Oral)   BP Readings from Last 3 Encounters:  11/12/17 (!) 130/58  08/14/17 (!) 153/78  06/24/17 133/74   Pulse Readings from Last 3 Encounters:  11/12/17 68  08/14/17 69  06/24/17 84    Constitutional: Well-developed, well-nourished, and in no  distress.   HENT: Head: Normocephalic and atraumatic.  Mouth/Throat: No oropharyngeal exudate. Mucosa moist. Eyes: Pupils are equal, round, and reactive to light. Conjunctivae are normal. No scleral icterus.  Neck: Normal range of motion. Neck supple. No JVD present.  Cardiovascular: Normal rate, regular rhythm and normal heart sounds.  Exam reveals no gallop and no friction rub.   No murmur heard. Pulmonary/Chest: Effort normal and breath sounds normal. No respiratory distress. No wheezes.No rales.  Abdominal: Soft. Bowel sounds are normal. No distension. There is no tenderness. There is no guarding.  Musculoskeletal: No edema or tenderness.  Lymphadenopathy: No cervical, axillary or supraclavicular adenopathy.  Neurological: Alert and oriented to person, place, and time. No cranial nerve deficit.  Skin: Skin is warm and dry. No rash.  Chronic solar changes. Psychiatric: Affect and judgment normal.  Bilateral breast exam: Chaperone present.  Right breast lumpectomy site healed well.  No palpable abnormalities noted bilaterally.  Labs No visits with results within 3 Day(s) from this visit.  Latest known visit with results is:  Hospital Outpatient Visit on 04/01/2017  Component Date Value Ref Range Status  . WBC 04/02/2017 10.1  4.0 - 10.5 K/uL Final  . RBC 04/02/2017 3.69* 3.87 - 5.11 MIL/uL Final  . Hemoglobin 04/02/2017 10.8* 12.0 - 15.0 g/dL Final  . HCT 04/02/2017 33.8* 36.0 - 46.0 % Final  . MCV 04/02/2017 91.6  78.0 - 100.0 fL Final  . MCH 04/02/2017 29.3  26.0 - 34.0 pg Final  . MCHC 04/02/2017 32.0  30.0 - 36.0 g/dL Final  . RDW 04/02/2017 13.7  11.5 - 15.5 % Final  . Platelets 04/02/2017 414* 150 - 400 K/uL Final  . Neutrophils Relative % 04/02/2017 72  % Final  . Neutro Abs 04/02/2017 7.3  1.7 - 7.7 K/uL Final  . Lymphocytes Relative 04/02/2017 18  % Final  . Lymphs Abs 04/02/2017 1.8  0.7 - 4.0 K/uL Final  . Monocytes Relative 04/02/2017 5  % Final  . Monocytes Absolute  04/02/2017 0.5  0.1 - 1.0 K/uL Final  . Eosinophils Relative 04/02/2017 5  % Final  . Eosinophils Absolute 04/02/2017 0.5  0.0 - 0.7 K/uL Final  . Basophils Relative 04/02/2017 0  % Final  . Basophils Absolute 04/02/2017 0.0  0.0 - 0.1 K/uL Final  . Sodium 04/02/2017 137  135 - 145 mmol/L Final  . Potassium 04/02/2017 3.8  3.5 - 5.1 mmol/L Final  . Chloride 04/02/2017 101  101 - 111 mmol/L Final  . CO2 04/02/2017 27  22 - 32 mmol/L Final  . Glucose, Bld 04/02/2017 112* 65 - 99 mg/dL Final  . BUN  04/02/2017 16  6 - 20 mg/dL Final  . Creatinine, Ser 04/02/2017 1.15* 0.44 - 1.00 mg/dL Final  . Calcium 04/02/2017 9.1  8.9 - 10.3 mg/dL Final  . GFR calc non Af Amer 04/02/2017 47* >60 mL/min Final  . GFR calc Af Amer 04/02/2017 55* >60 mL/min Final   Comment: (NOTE) The eGFR has been calculated using the CKD EPI equation. This calculation has not been validated in all clinical situations. eGFR's persistently <60 mL/min signify possible Chronic Kidney Disease.   . Anion gap 04/02/2017 9  5 - 15 Final     Pathology Orders Placed This Encounter  Procedures  . CBC with Differential/Platelet    Standing Status:   Future    Standing Expiration Date:   11/13/2018  . Comprehensive metabolic panel    Standing Status:   Future    Standing Expiration Date:   11/13/2018  . Lactate dehydrogenase    Standing Status:   Future    Standing Expiration Date:   11/13/2018       Zoila Shutter MD

## 2017-11-12 NOTE — Patient Instructions (Addendum)
  [place] at [facility] Discharge Instructions   You were seen today by Dr. Zoila Shutter. Make sure you have your mammogram done in May in [place]. Return in June for labs and follow up.   Thank you for choosing [place] at [facility] to provide your oncology and hematology care.  To afford each patient quality time with our provider, please arrive at least 15 minutes before your scheduled appointment time.    If you have a lab appointment with the [facility] please come in thru the  Main Entrance and check in at the main information desk  You need to re-schedule your appointment should you arrive 10 or more minutes late.  We strive to give you quality time with our providers, and arriving late affects you and other patients whose appointments are after yours.  Also, if you no show three or more times for appointments you may be dismissed from the clinic at the providers discretion.     Again, thank you for choosing [facility].  Our hope is that these requests will decrease the amount of time that you wait before being seen by our physicians.       _____________________________________________________________  Should you have questions after your visit to [facility], please contact our office at [phone] between the hours of 8:30 a.m. and 4:30 p.m.  Voicemails left after 4:30 p.m. will not be returned until the following business day.  For prescription refill requests, have your pharmacy contact our office.       Resources For Cancer Patients and their Caregivers ? American Cancer Society: Can assist with transportation, wigs, general needs, runs Look Good Feel Better.        614-135-2624 ? Cancer Care: Provides financial assistance, online support groups, medication/co-pay assistance.  1-800-813-HOPE 432 643 9782) ? [employer] Assists [place] cancer patients and their  families through emotional , educational and financial support.  413-355-2271 ? [place] DSS Where to apply for food stamps, Medicaid and utility assistance. (867)310-0802 ? [employer]: Transportation to medical appointments. 339-862-0760 ? Social Security Administration: May apply for disability if have a Stage IV cancer. (719)563-5815 779-765-6989 ? [employer], Disability and Transit Services: Assists with nutrition, care and transit needs. [place] Support Programs:   > Cancer Support Group  2nd Tuesday of the month 1pm-2pm, Journey Room   > Creative Journey  3rd Tuesday of the month 1130am-1pm, Journey Room

## 2017-12-08 ENCOUNTER — Other Ambulatory Visit: Payer: Self-pay | Admitting: Physician Assistant

## 2018-01-14 ENCOUNTER — Ambulatory Visit (HOSPITAL_COMMUNITY): Payer: Medicare Other | Admitting: Internal Medicine

## 2018-01-14 ENCOUNTER — Other Ambulatory Visit (HOSPITAL_COMMUNITY): Payer: Medicare Other

## 2018-04-01 ENCOUNTER — Other Ambulatory Visit: Payer: Self-pay | Admitting: Oncology

## 2018-04-01 DIAGNOSIS — Z853 Personal history of malignant neoplasm of breast: Secondary | ICD-10-CM

## 2018-04-08 ENCOUNTER — Encounter (HOSPITAL_COMMUNITY): Payer: Medicare Other

## 2018-08-18 IMAGING — DX DG CHEST 2V
2 series · 2 of 2 positions shown · non-contrast
Comparison: 11/06/2013 and prior exams

CLINICAL DATA: Preoperative examination prior to breast cancer
surgery.

EXAM:
CHEST  2 VIEW

[chest pa]
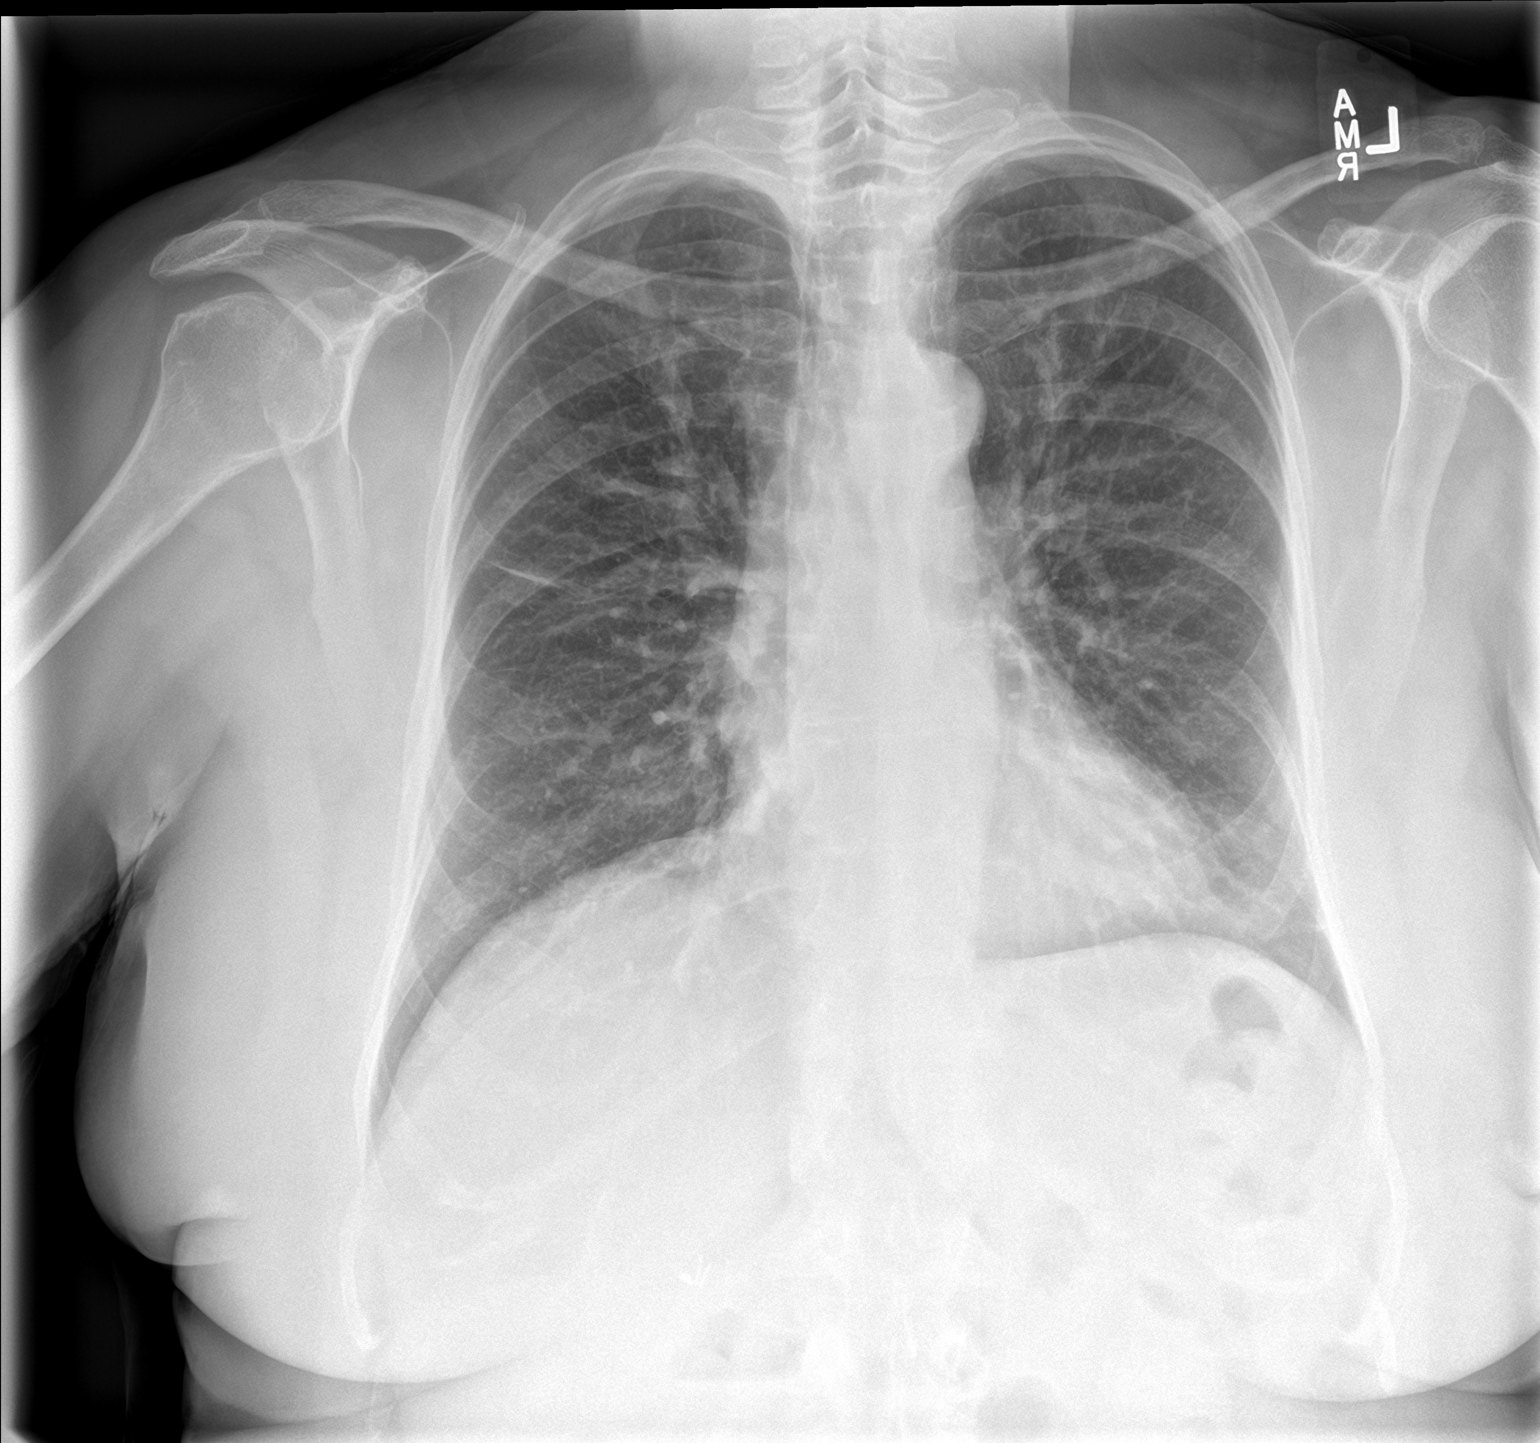

[chest lat]
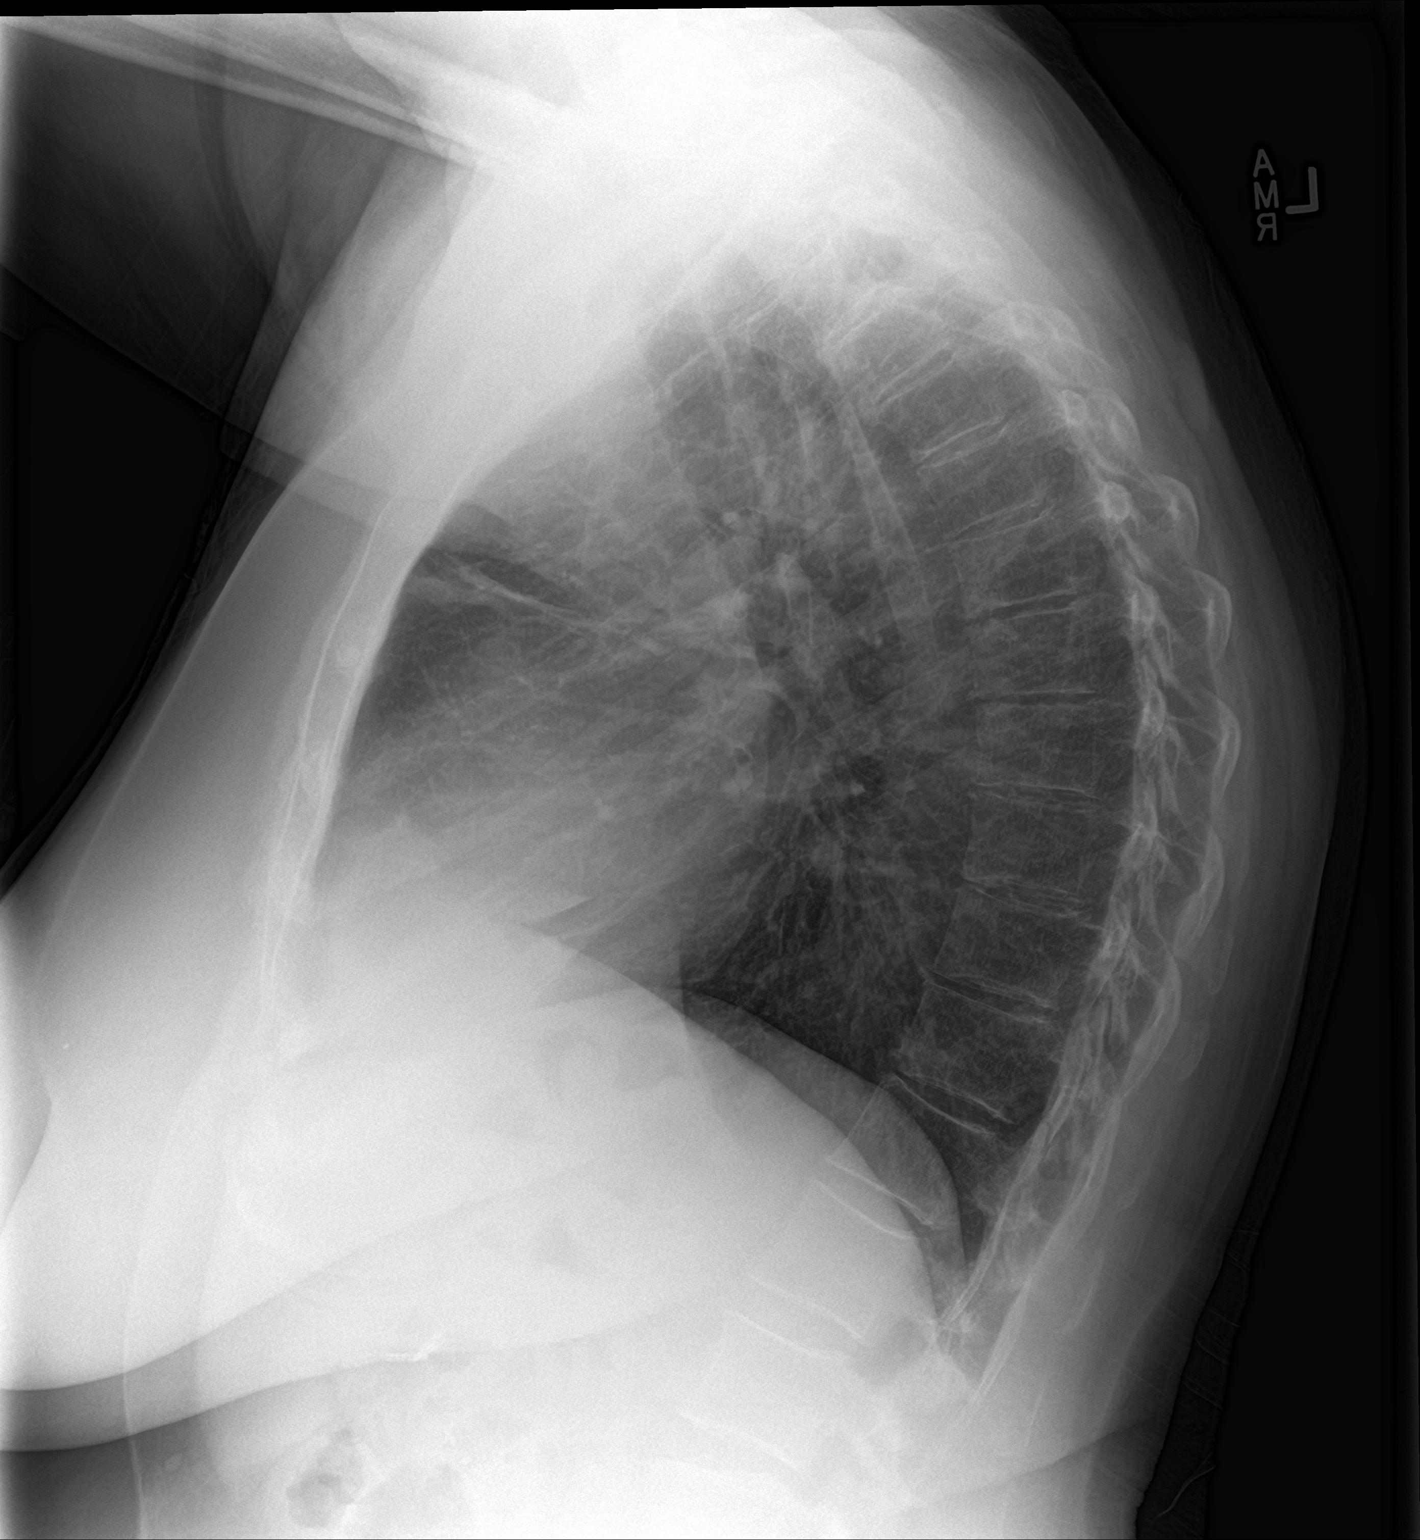

[2 of 2 positions shown; findings below may reference images not displayed]

FINDINGS: The cardiomediastinal silhouette is unremarkable.

Mild elevation of the right hemidiaphragm is unchanged.

Minimal subsegmental atelectasis/ scarring in the mid right lung
noted.

Mild peribronchial thickening is unchanged.

There is no evidence of focal airspace disease, pulmonary edema,
suspicious pulmonary nodule/mass, pleural effusion, or pneumothorax.
No acute bony abnormalities are identified.
IMPRESSION: 1. Minimal subsegmental atelectasis/ scarring in the mid right lung
2. Mild chronic peribronchial thickening

## 2019-05-27 ENCOUNTER — Encounter (HOSPITAL_COMMUNITY): Payer: Self-pay | Admitting: Emergency Medicine

## 2019-05-27 ENCOUNTER — Emergency Department (HOSPITAL_COMMUNITY)
Admission: EM | Admit: 2019-05-27 | Discharge: 2019-05-27 | Disposition: A | Discharge status: Home / Self Care | Payer: Medicare Other | Attending: Emergency Medicine | Admitting: Emergency Medicine

## 2019-05-27 ENCOUNTER — Other Ambulatory Visit: Payer: Self-pay

## 2019-05-27 ENCOUNTER — Emergency Department (HOSPITAL_COMMUNITY): Payer: Medicare Other

## 2019-05-27 DIAGNOSIS — Z79899 Other long term (current) drug therapy: Secondary | ICD-10-CM | POA: Insufficient documentation

## 2019-05-27 DIAGNOSIS — E039 Hypothyroidism, unspecified: Secondary | ICD-10-CM | POA: Diagnosis not present

## 2019-05-27 DIAGNOSIS — I10 Essential (primary) hypertension: Secondary | ICD-10-CM | POA: Diagnosis not present

## 2019-05-27 DIAGNOSIS — Z7982 Long term (current) use of aspirin: Secondary | ICD-10-CM | POA: Diagnosis not present

## 2019-05-27 DIAGNOSIS — Z87891 Personal history of nicotine dependence: Secondary | ICD-10-CM | POA: Insufficient documentation

## 2019-05-27 DIAGNOSIS — M79604 Pain in right leg: Secondary | ICD-10-CM

## 2019-05-27 DIAGNOSIS — J45909 Unspecified asthma, uncomplicated: Secondary | ICD-10-CM | POA: Insufficient documentation

## 2019-05-27 LAB — COMPREHENSIVE METABOLIC PANEL
ALT: 20 U/L (ref 0–44)
AST: 19 U/L (ref 15–41)
Albumin: 3.9 g/dL (ref 3.5–5.0)
Alkaline Phosphatase: 78 U/L (ref 38–126)
Anion gap: 15 (ref 5–15)
BUN: 24 mg/dL — ABNORMAL HIGH (ref 8–23)
CO2: 22 mmol/L (ref 22–32)
Calcium: 9 mg/dL (ref 8.9–10.3)
Chloride: 97 mmol/L — ABNORMAL LOW (ref 98–111)
Creatinine, Ser: 1.29 mg/dL — ABNORMAL HIGH (ref 0.44–1.00)
GFR calc Af Amer: 48 mL/min — ABNORMAL LOW (ref >60)
GFR calc non Af Amer: 41 mL/min — ABNORMAL LOW (ref >60)
Glucose, Bld: 86 mg/dL (ref 70–99)
Potassium: 4.7 mmol/L (ref 3.5–5.1)
Sodium: 134 mmol/L — ABNORMAL LOW (ref 135–145)
Total Bilirubin: 0.5 mg/dL (ref 0.3–1.2)
Total Protein: 7.3 g/dL (ref 6.5–8.1)

## 2019-05-27 LAB — CBC WITH DIFFERENTIAL/PLATELET
Abs Immature Granulocytes: 0.15 10*3/uL — ABNORMAL HIGH (ref 0.00–0.07)
Basophils Absolute: 0.1 10*3/uL (ref 0.0–0.1)
Basophils Relative: 1 %
Eosinophils Absolute: 0.4 10*3/uL (ref 0.0–0.5)
Eosinophils Relative: 3 %
HCT: 33.1 % — ABNORMAL LOW (ref 36.0–46.0)
Hemoglobin: 10.6 g/dL — ABNORMAL LOW (ref 12.0–15.0)
Immature Granulocytes: 1 %
Lymphocytes Relative: 14 %
Lymphs Abs: 2.2 10*3/uL (ref 0.7–4.0)
MCH: 30.2 pg (ref 26.0–34.0)
MCHC: 32 g/dL (ref 30.0–36.0)
MCV: 94.3 fL (ref 80.0–100.0)
Monocytes Absolute: 0.9 10*3/uL (ref 0.1–1.0)
Monocytes Relative: 6 %
Neutro Abs: 11.4 10*3/uL — ABNORMAL HIGH (ref 1.7–7.7)
Neutrophils Relative %: 75 %
Platelets: 401 10*3/uL — ABNORMAL HIGH (ref 150–400)
RBC: 3.51 MIL/uL — ABNORMAL LOW (ref 3.87–5.11)
RDW: 14.7 % (ref 11.5–15.5)
WBC: 15.1 10*3/uL — ABNORMAL HIGH (ref 4.0–10.5)
nRBC: 0 % (ref 0.0–0.2)

## 2019-05-27 MED ORDER — FUROSEMIDE 20 MG PO TABS: 20.0000 mg | ORAL_TABLET | Freq: Every day | ORAL | 0 refills | Status: DC

## 2019-05-27 MED ORDER — HYDROMORPHONE HCL 1 MG/ML IJ SOLN
1.0000 mg | Freq: Once | INTRAMUSCULAR | Status: AC
  Administered 2019-05-27: 1 mg via INTRAVENOUS
  Filled 2019-05-27: qty 1

## 2019-05-27 MED ORDER — OXYCODONE-ACETAMINOPHEN 5-325 MG PO TABS: 1.0000 | ORAL_TABLET | Freq: Four times a day (QID) | ORAL | 0 refills | Status: DC | PRN

## 2019-05-27 MED ORDER — ONDANSETRON HCL 4 MG/2ML IJ SOLN
4.0000 mg | Freq: Once | INTRAMUSCULAR | Status: AC
  Administered 2019-05-27: 4 mg via INTRAVENOUS
  Filled 2019-05-27: qty 2

## 2019-05-27 NOTE — ED Triage Notes (Signed)
Patient complaining of right knee pain and swelling and swelling to bilateral ankles x 1 week. States she saw PCP last week for same and called them today and was told to come to ER. Denies injury.

## 2019-05-27 NOTE — ED Provider Notes (Signed)
[facility] EMERGENCY DEPARTMENT Provider Note   CSN: [id number] Arrival date & time: 05/27/19  1418     History   Chief Complaint Chief Complaint  Patient presents with  . Leg Pain    HPI Sara Solomon is a 73 y.o. female.     Patient complains of swelling to legs and pain in her right calf  The history is provided by the patient. No language interpreter was used.  Leg Pain Location:  Leg Leg location:  R leg Pain details:    Quality:  Aching   Radiates to:  Does not radiate   Severity:  Moderate   Onset quality:  Sudden   Timing:  Constant   Progression:  Worsening Associated symptoms: no back pain and no fatigue     Past Medical History:  Diagnosis Date  . Anemia   . Anxiety   . Arthritis   . Asthma   . Hypertension   . Hypothyroidism   . Thyroid disease     Patient Active Problem List   Diagnosis Date Noted  . 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 Malignant neoplasm of upper-outer quadrant of female breast ([place])   . Hypothyroidism 09/15/2015  . Essential hypertension 09/15/2015  . Arthritis 09/15/2015  . Colitis 09/15/2015    Past Surgical History:  Procedure Laterality Date  . ABDOMINAL HYSTERECTOMY    . CATARACT EXTRACTION, BILATERAL    . CHOLECYSTECTOMY    . MASTECTOMY, PARTIAL Right 05/01/2017   Procedure: MASTECTOMY PARTIAL;  Surgeon: Aviva Signs, MD;  Location: [facility];  Service: General;  Laterality: Right;  . PARTIAL MASTECTOMY WITH NEEDLE LOCALIZATION AND AXILLARY SENTINEL LYMPH NODE BX Right 04/03/2017   Procedure: PARTIAL MASTECTOMY WITH NEEDLE LOCALIZATION AND AXILLARY SENTINEL LYMPH NODE BX;  Surgeon: Aviva Signs, MD;  Location: [facility];  Service: General;  Laterality: Right;     OB History   No obstetric history on file.      Home Medications    Prior to Admission medications   Medication Sig Start Date End Date Taking? Authorizing Provider  albuterol (PROVENTIL HFA;VENTOLIN HFA) 108  (90 Base) MCG/ACT inhaler Inhale 1-2 puffs into the lungs every 6 (six) hours as needed for wheezing or shortness of breath.    [provider]  aspirin EC 81 MG tablet Take 81 mg by mouth daily.    [provider]  Calcium Carb-Cholecalciferol (CALCIUM PLUS VITAMIN D3) 600-500 MG-UNIT CAPS Take by mouth.    [provider]  Calcium Carbonate-Vitamin D (CALCIUM 600+D PO) Take 1 tablet by mouth daily.    [provider]  ferrous gluconate (FERGON) 324 MG tablet Take 324 mg by mouth daily.    [provider]  furosemide (LASIX) 20 MG tablet Take 1 tablet (20 mg total) by mouth daily. 05/27/19   Milton Ferguson, MD  ibuprofen (ADVIL,MOTRIN) 200 MG tablet Take 800 mg by mouth daily as needed for headache or moderate pain.    [provider]  letrozole (FEMARA) 2.5 MG tablet Take 1 tablet (2.5 mg total) by mouth daily. 11/12/17   Higgs, Mathis Dad, MD  levothyroxine (SYNTHROID, LEVOTHROID) 100 MCG tablet Take 100 mcg by mouth daily before breakfast.    [provider]  lisinopril-hydrochlorothiazide (PRINZIDE,ZESTORETIC) 20-25 MG per tablet Take 1 tablet by mouth daily.    [provider]  omega-3 acid ethyl esters (LOVAZA) 1 g capsule Take 1,000 mg by mouth.    [provider]  Omega-3 Fatty Acids (FISH OIL) 1000 MG CAPS Take 1,000 mg by mouth daily.     [provider]  omeprazole (PRILOSEC) 40 MG capsule Take 1 capsule (40 mg total) by mouth daily. 09/15/15   Setzer, Rona Ravens, NP  oxyCODONE-acetaminophen (PERCOCET/ROXICET) 5-325 MG tablet Take 1 tablet by mouth every 6 (six) hours as needed. 05/27/19   Milton Ferguson, MD  promethazine (PHENERGAN) 25 MG tablet Take 25 mg every 6 (six) hours as needed by mouth for nausea or vomiting.    [provider]    Family History History reviewed. No pertinent family history.  Social History Social History   Tobacco Use  . Smoking status: Former Research scientist (life sciences)  . Smokeless  tobacco: Never Used  . Tobacco comment: quit smoking in 1998  Substance Use Topics  . Alcohol use: No    Alcohol/week: 0.0 standard drinks  . Drug use: No     Allergies   Other and Penicillins   Review of Systems Review of Systems  Constitutional: Negative for appetite change and fatigue.  HENT: Negative for congestion, ear discharge and sinus pressure.   Eyes: Negative for discharge.  Respiratory: Negative for cough.   Cardiovascular: Negative for chest pain.  Gastrointestinal: Negative for abdominal pain and diarrhea.  Genitourinary: Negative for frequency and hematuria.  Musculoskeletal: Negative for back pain.       Bilateral lower extremity edema with tenderness to right calf  Skin: Negative for rash.  Neurological: Negative for seizures and headaches.  Psychiatric/Behavioral: Negative for hallucinations.     Physical Exam Updated Vital Signs BP (!) 174/82   Pulse 77   Temp 98.6 F (37 C) (Oral)   Resp 16   Ht 5\' 5"  (1.651 m)   Wt 88.9 kg   SpO2 99%   BMI 32.62 kg/m   Physical Exam Vitals signs and nursing note reviewed.  Constitutional:      Appearance: She is well-developed.  HENT:     Head: Normocephalic.     Nose: Nose normal.  Eyes:     General: No scleral icterus.    Conjunctiva/sclera: Conjunctivae normal.  Neck:     Musculoskeletal: Neck supple.     Thyroid: No thyromegaly.  Cardiovascular:     Rate and Rhythm: Normal rate and regular rhythm.     Heart sounds: No murmur. No friction rub. No gallop.   Pulmonary:     Breath sounds: No stridor. No wheezing or rales.  Chest:     Chest wall: No tenderness.  Abdominal:     General: There is no distension.     Tenderness: There is no abdominal tenderness. There is no rebound.  Musculoskeletal:     Comments: Edema 2+ in both calves with tenderness to right calf  Lymphadenopathy:     Cervical: No cervical adenopathy.  Skin:    Findings: No erythema or rash.  Neurological:     Mental Status:  She is oriented to person, place, and time.     Motor: No abnormal muscle tone.     Coordination: Coordination normal.  Psychiatric:        Behavior: Behavior normal.      ED Treatments / Results  Labs (all labs ordered are listed, but only abnormal results are displayed) Labs Reviewed  CBC WITH DIFFERENTIAL/PLATELET - Abnormal; Notable for the following components:      Result Value   WBC 15.1 (*)    RBC 3.51 (*)    Hemoglobin 10.6 (*)  HCT 33.1 (*)    Platelets 401 (*)    Neutro Abs 11.4 (*)    Abs Immature Granulocytes 0.15 (*)    All other components within normal limits  COMPREHENSIVE METABOLIC PANEL - Abnormal; Notable for the following components:   Sodium 134 (*)    Chloride 97 (*)    BUN 24 (*)    Creatinine, Ser 1.29 (*)    GFR calc non Af Amer 41 (*)    GFR calc Af Amer 48 (*)    All other components within normal limits    EKG None  Radiology US Venous Img Lower Unilateral Right  Result Date: 05/27/2019 CLINICAL DATA:  Right lower extremity pain and edema. EXAM: RIGHT LOWER EXTREMITY VENOUS DOPPLER ULTRASOUND TECHNIQUE: Gray-scale sonography with graded compression, as well as color Doppler and duplex ultrasound were performed to evaluate the lower extremity deep venous systems from the level of the common femoral vein and including the common femoral, femoral, profunda femoral, popliteal and calf veins including the posterior tibial, peroneal and gastrocnemius veins when visible. The superficial great saphenous vein was also interrogated. Spectral Doppler was utilized to evaluate flow at rest and with distal augmentation maneuvers in the common femoral, femoral and popliteal veins. COMPARISON:  None. FINDINGS: Contralateral Common Femoral Vein: Respiratory phasicity is normal and symmetric with the symptomatic side. No evidence of thrombus. Normal compressibility. Common Femoral Vein: No evidence of thrombus. Normal compressibility, respiratory phasicity and  response to augmentation. Saphenofemoral Junction: No evidence of thrombus. Normal compressibility and flow on color Doppler imaging. Profunda Femoral Vein: No evidence of thrombus. Normal compressibility and flow on color Doppler imaging. Femoral Vein: No evidence of thrombus. Normal compressibility, respiratory phasicity and response to augmentation. Popliteal Vein: No evidence of thrombus. Normal compressibility, respiratory phasicity and response to augmentation. Calf Veins: No evidence of thrombus. Normal compressibility and flow on color Doppler imaging. Superficial Great Saphenous Vein: No evidence of thrombus. Normal compressibility. Venous Reflux:  None. Other Findings: No evidence of superficial thrombophlebitis or abnormal fluid collection. IMPRESSION: No evidence of right lower extremity deep venous thrombosis. Electronically Signed   By: Aletta Edouard M.D.   On: 05/27/2019 16:08   Dg Knee Complete 4 Views Right  Result Date: 05/27/2019 CLINICAL DATA:  Patient complaining of right knee pain and swelling and swelling to bilateral ankles x 1 week. States she saw PCP last week for same and called them today and was told to come to ER. Denies injury. PT denies any on body medical injector devices. EXAM: RIGHT KNEE - COMPLETE 4+ VIEW COMPARISON:  None. FINDINGS: No evidence of fracture, dislocation, or joint effusion. No evidence of arthropathy or other focal bone abnormality. Soft tissues are unremarkable. IMPRESSION: Negative. Electronically Signed   By: Lajean Manes M.D.   On: 05/27/2019 16:14    Procedures Procedures (including critical care time)  Medications Ordered in ED Medications  HYDROmorphone (DILAUDID) injection 1 mg (1 mg Intravenous Given 05/27/19 1637)  ondansetron (ZOFRAN) injection 4 mg (4 mg Intravenous Given 05/27/19 1637)     Initial Impression / Assessment and Plan / ED Course  I have reviewed the triage vital signs and the nursing notes.  Pertinent labs & imaging  results that were available during my care of the patient were reviewed by me and considered in my medical decision making (see chart for details).       Ultrasound of the legs is negative for DVT labs unremarkable.  Patient will be given pain medicine and  diuretics and she will follow-up with her PCP.  The pain medicine here helped the discomfort in her leg  Final Clinical Impressions(s) / ED Diagnoses   Final diagnoses:  Right leg pain    ED Discharge Orders         Ordered    furosemide (LASIX) 20 MG tablet  Daily     05/27/19 1811    oxyCODONE-acetaminophen (PERCOCET/ROXICET) 5-325 MG tablet  Every 6 hours PRN     05/27/19 1811           Milton Ferguson, MD 05/27/19 1819

## 2020-10-12 IMAGING — US US EXTREM LOW VENOUS*R*
1 series · 13 of 24 positions shown · non-contrast
Comparison: None.

CLINICAL DATA: Right lower extremity pain and edema.



[Series 1: us extrem low venous*right* · 13 of 35 slices shown]
[im 1/35]
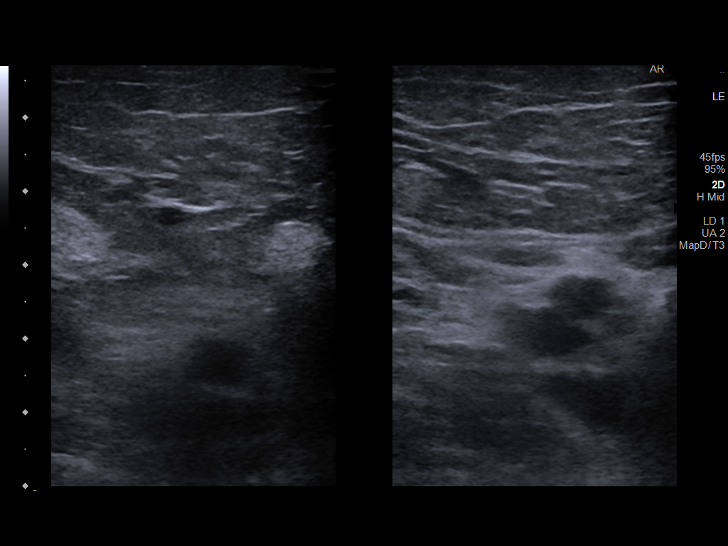
[im 3/35]
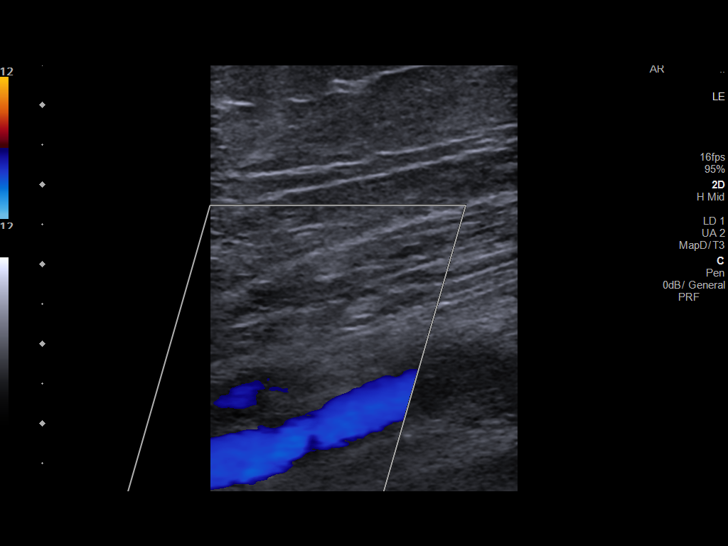
[im 6/35]
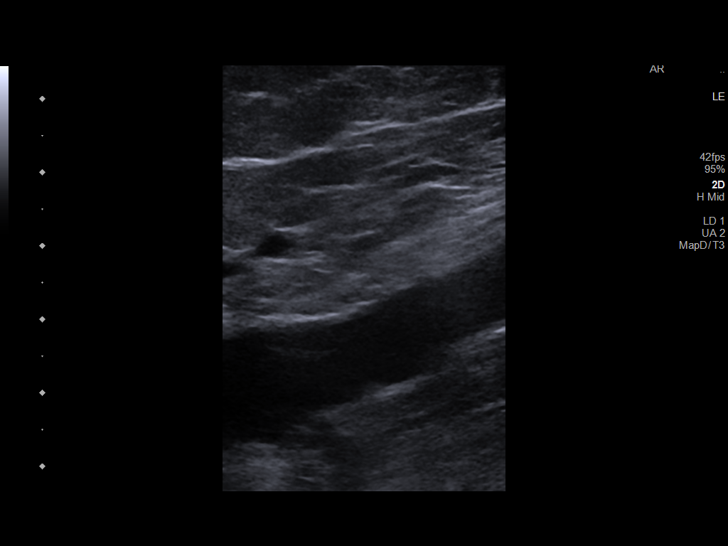
[im 9/35]
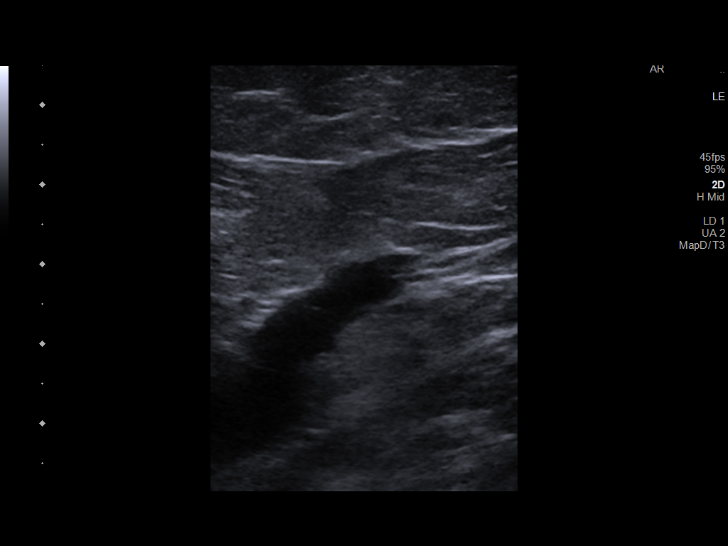
[im 12/35]
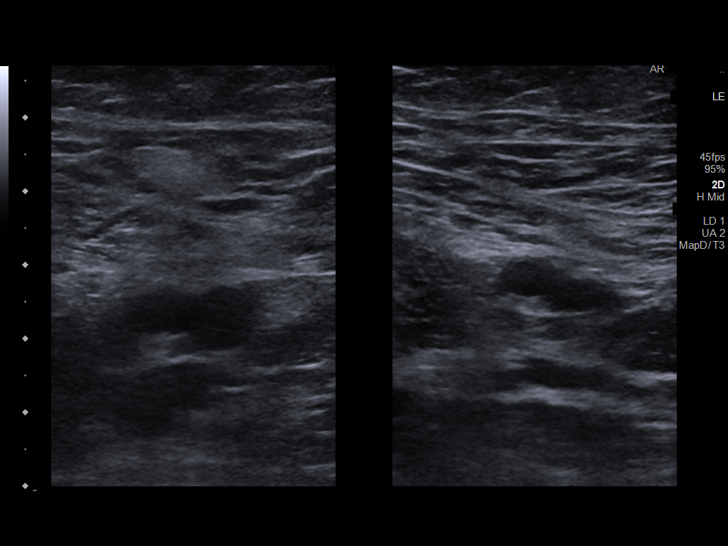
[im 15/35]
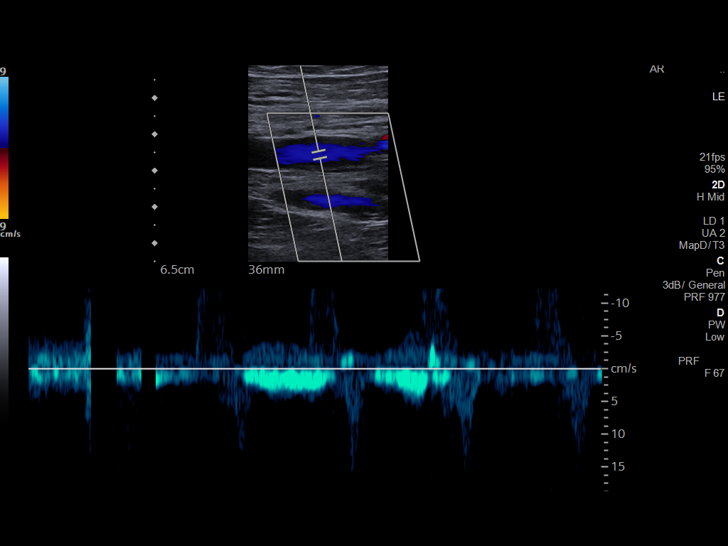
[im 18/35]
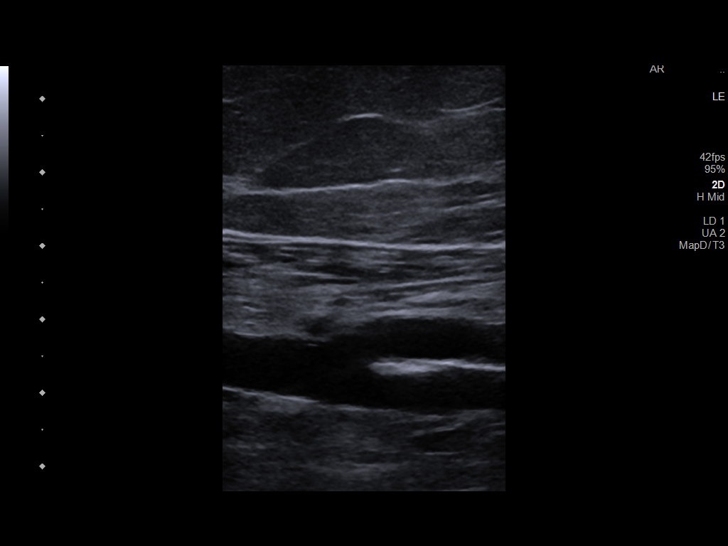
[im 20/35]
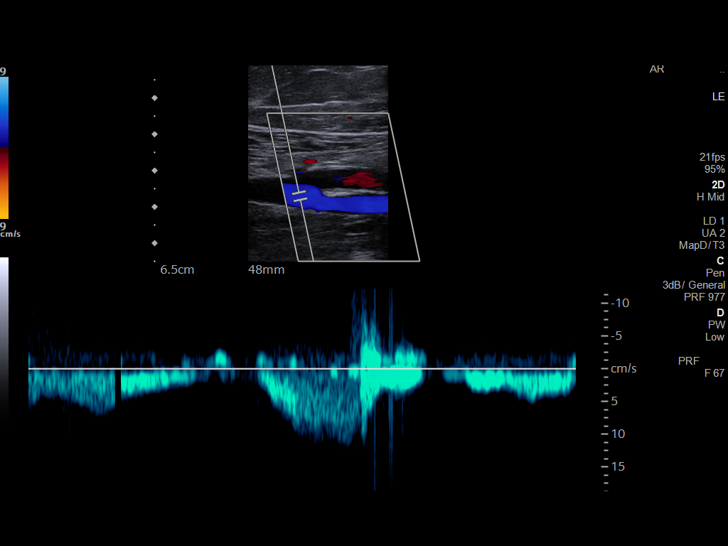
[im 23/35]
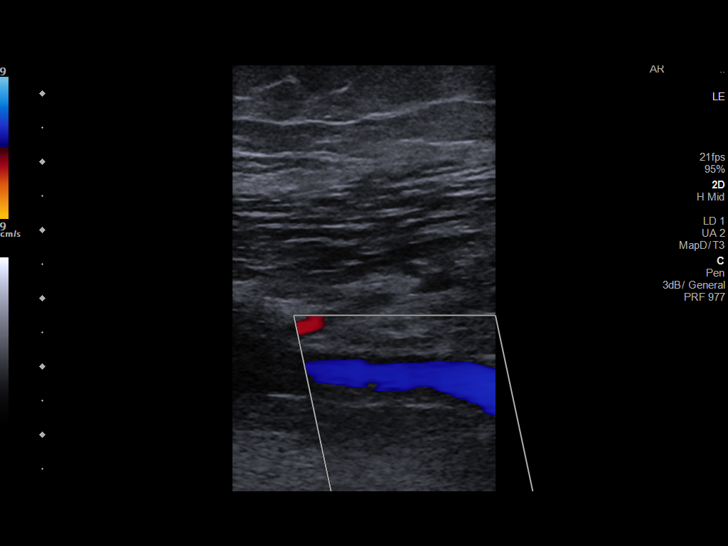
[im 26/35]
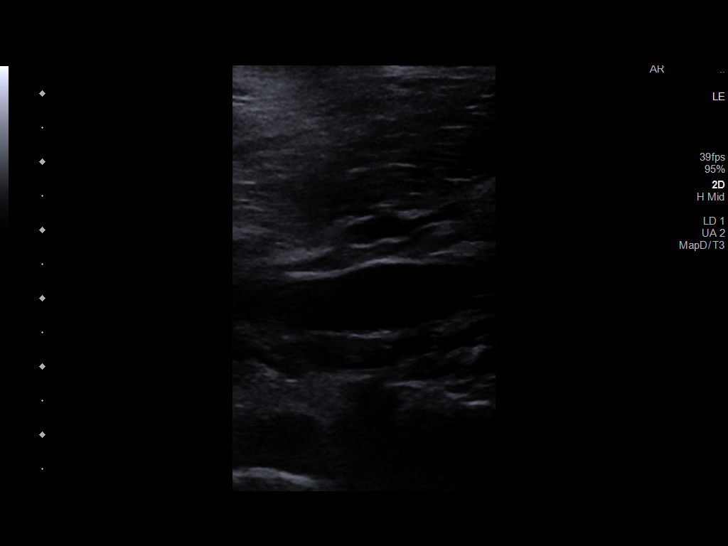
[im 29/35]
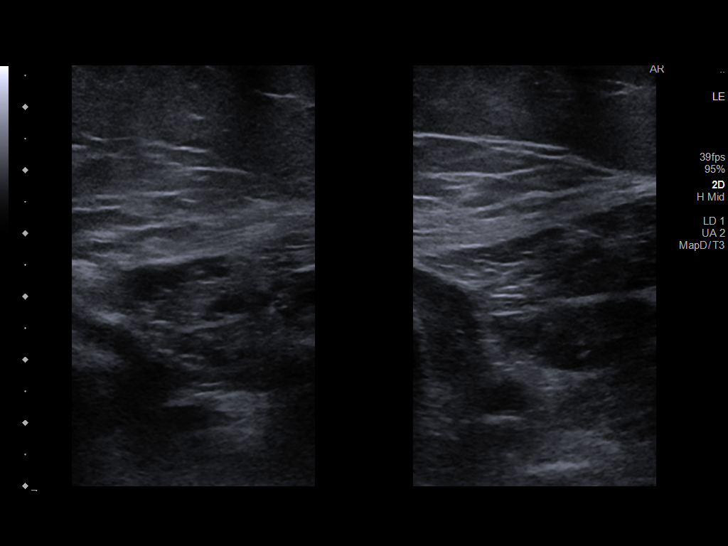
[im 32/35]
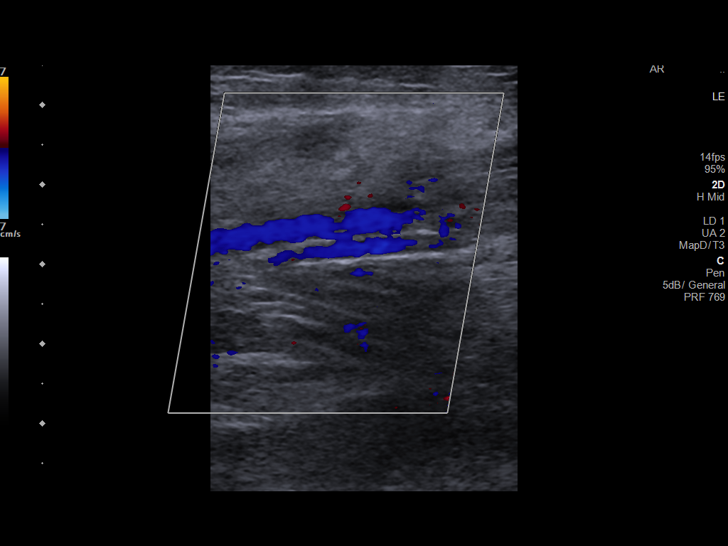
[im 35/35]
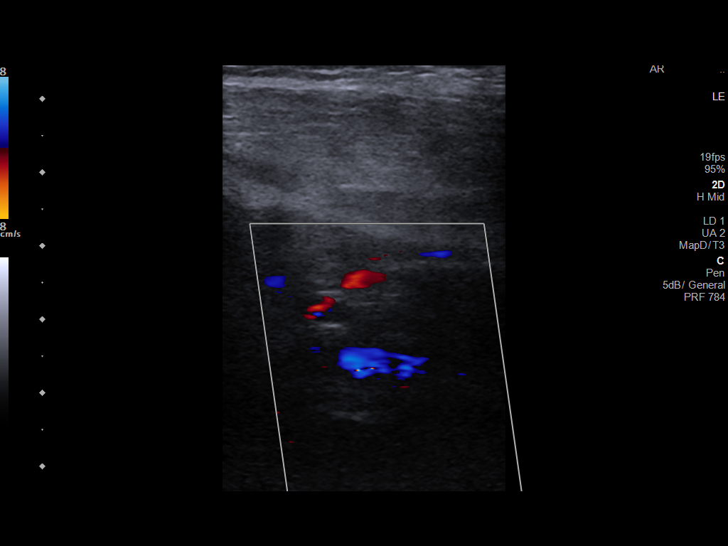

[13 of 24 positions shown; findings below may reference images not displayed]

FINDINGS: Contralateral Common Femoral Vein: Respiratory phasicity is normal
and symmetric with the symptomatic side. No evidence of thrombus.
Normal compressibility.

Common Femoral Vein: No evidence of thrombus. Normal
compressibility, respiratory phasicity and response to augmentation.

Saphenofemoral Junction: No evidence of thrombus. Normal
compressibility and flow on color Doppler imaging.

Profunda Femoral Vein: No evidence of thrombus. Normal
compressibility and flow on color Doppler imaging.

Femoral Vein: No evidence of thrombus. Normal compressibility,
respiratory phasicity and response to augmentation.

Popliteal Vein: No evidence of thrombus. Normal compressibility,
respiratory phasicity and response to augmentation.

Calf Veins: No evidence of thrombus. Normal compressibility and flow
on color Doppler imaging.

Superficial Great Saphenous Vein: No evidence of thrombus. Normal
compressibility.

Venous Reflux:  None.

Other Findings: No evidence of superficial thrombophlebitis or
abnormal fluid collection.
IMPRESSION: No evidence of right lower extremity deep venous thrombosis.

## 2020-10-12 IMAGING — DX DG KNEE COMPLETE 4+V*R*
4 series · 4 of 4 positions shown · non-contrast
Comparison: None.

CLINICAL DATA: Patient complaining of right knee pain and swelling
and swelling to bilateral ankles x 1 week. States she saw PCP last
week for same and called them today and was told to come to ER.
Denies injury. PT denies any on body medical injector devices.

EXAM:
RIGHT KNEE - COMPLETE 4+ VIEW

[knee ap]
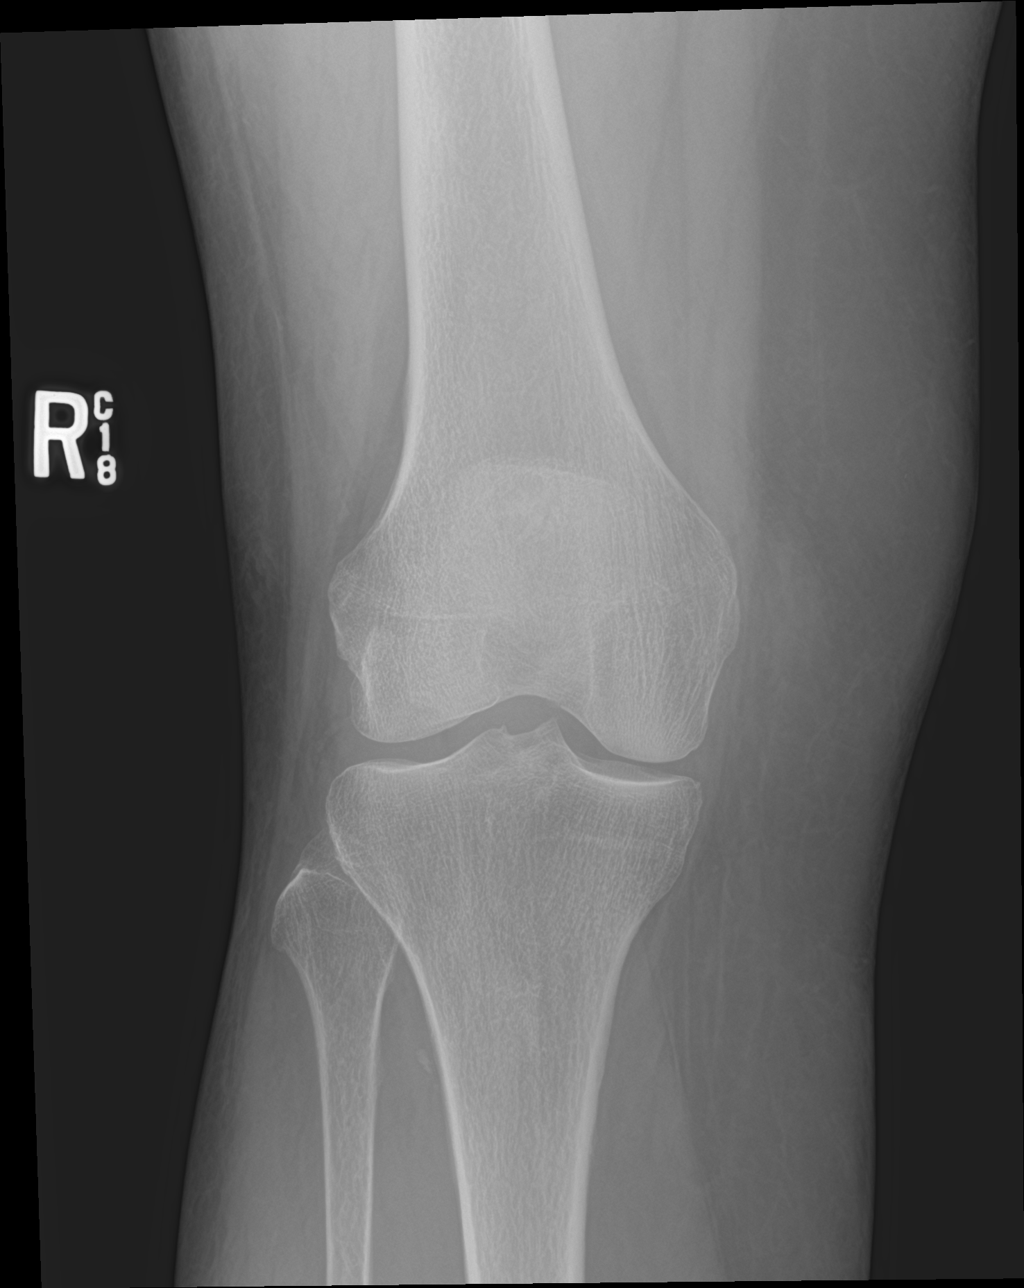

[tunnel]
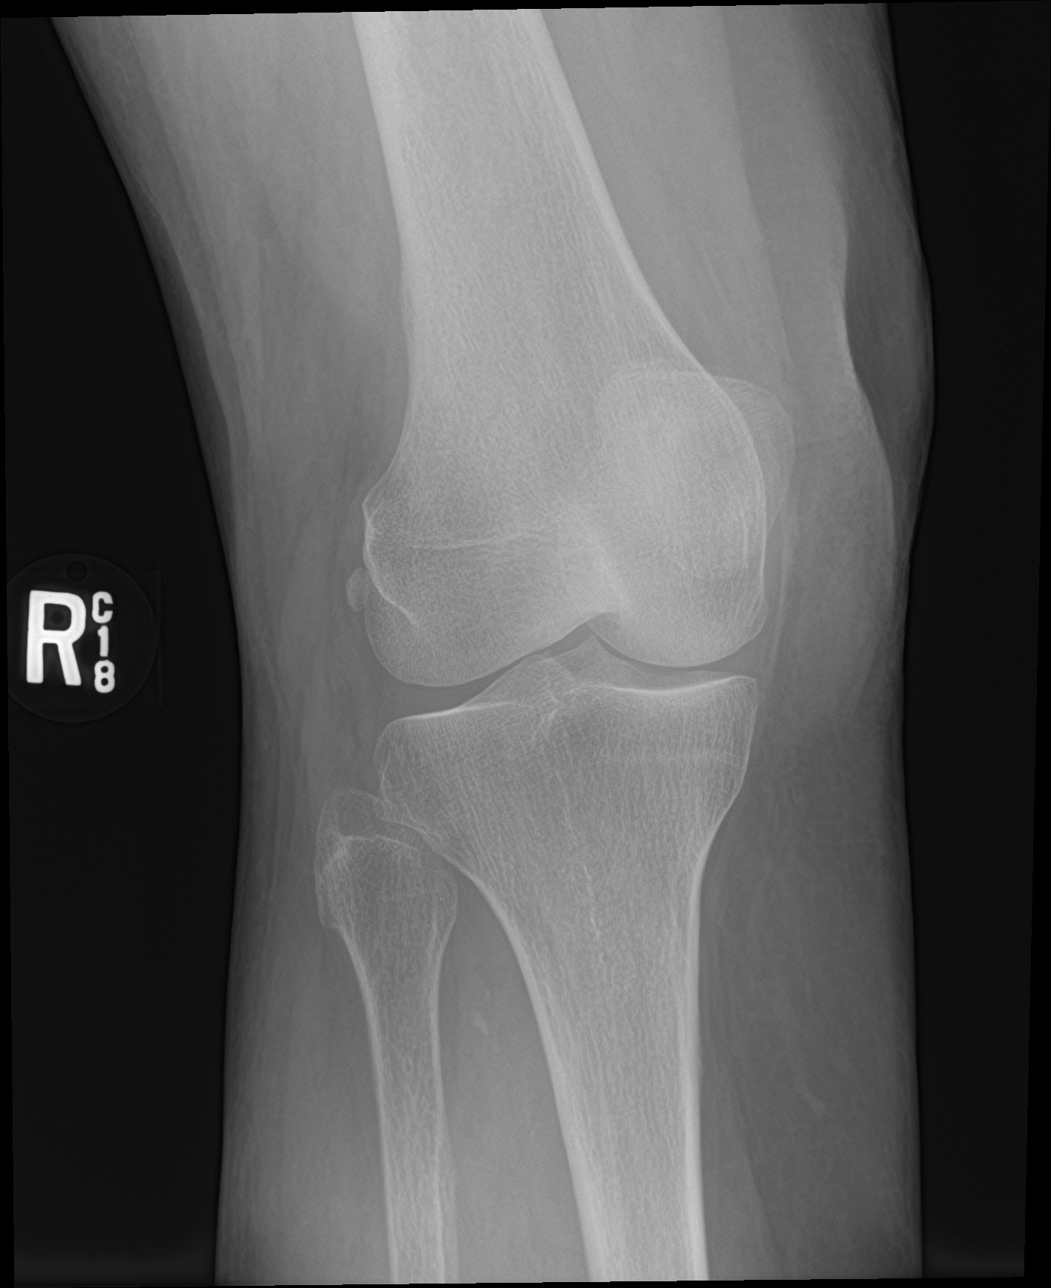

[knee lat]
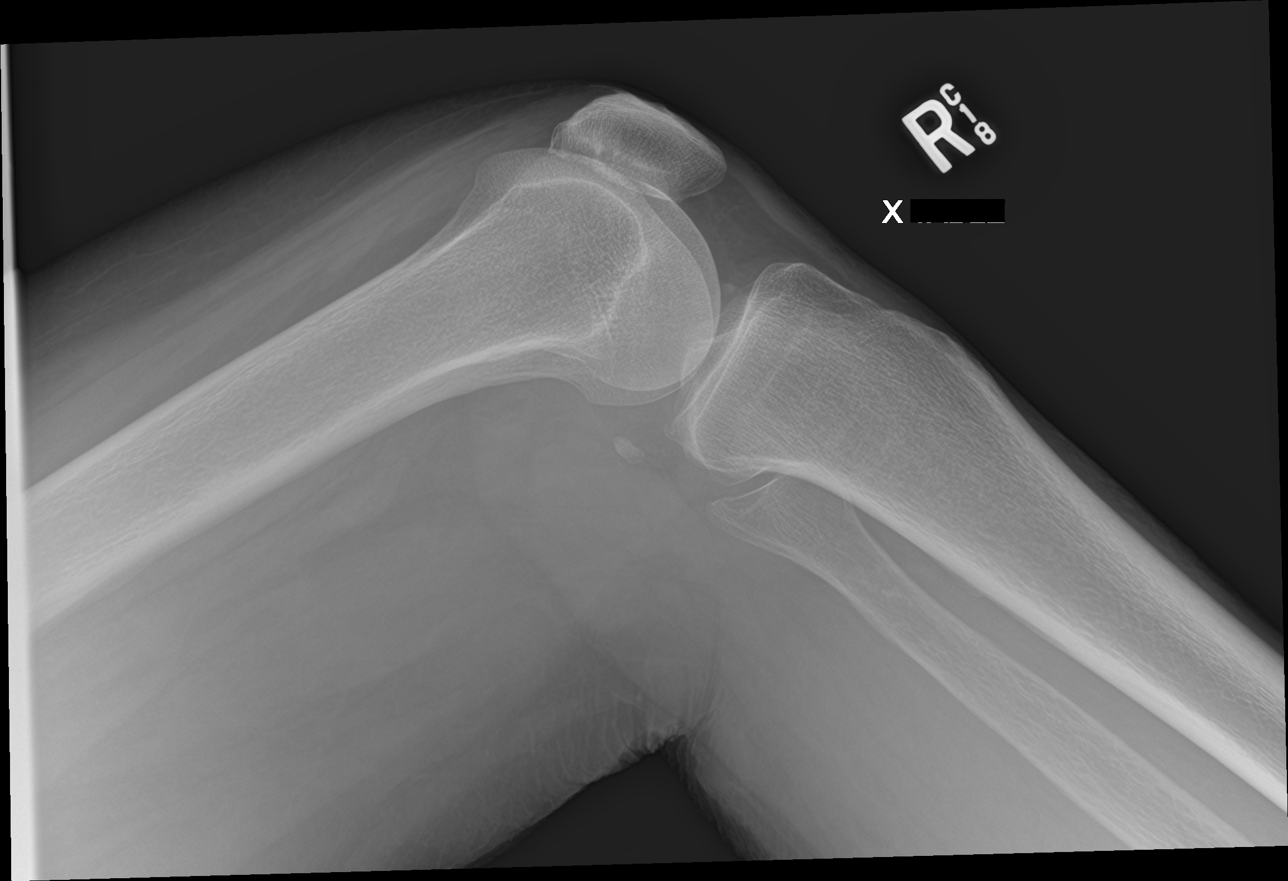

[knee sunrise]
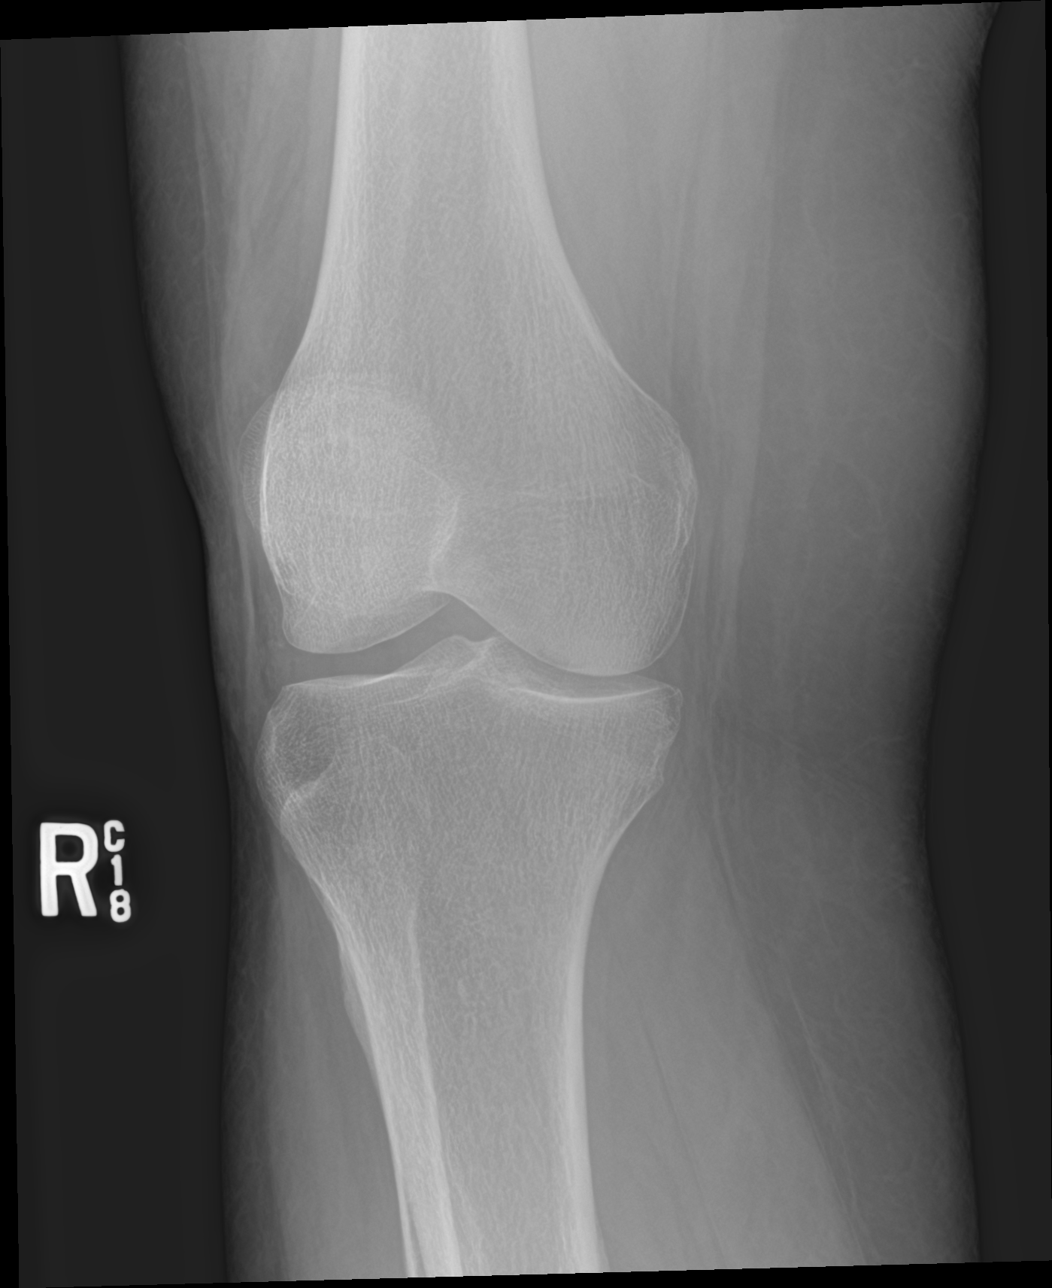

[4 of 4 positions shown; findings below may reference images not displayed]

FINDINGS: No evidence of fracture, dislocation, or joint effusion. No evidence
of arthropathy or other focal bone abnormality. Soft tissues are
unremarkable.
IMPRESSION: Negative.

## 2022-03-27 ENCOUNTER — Other Ambulatory Visit: Payer: Self-pay | Admitting: *Deleted

## 2022-03-27 NOTE — Patient Outreach (Signed)
  Care Coordination   03/27/2022 Name: Sara Solomon MRN: [id number] DOB: 30-Dec-1945   Care Coordination Outreach Attempts:  An unsuccessful telephone outreach was attempted today to offer the patient information about available care coordination services as a benefit of their health plan.   Follow Up Plan:  Additional outreach attempts will be made to offer the patient care coordination information and services.   Encounter Outcome:  No Answer  Care Coordination Interventions Activated:  No   Care Coordination Interventions:  No, not indicated    Valente David, RN, MSN, [facility] Care Coordinator [phone]

## 2022-04-02 ENCOUNTER — Other Ambulatory Visit: Payer: Self-pay | Admitting: *Deleted

## 2022-04-02 ENCOUNTER — Encounter: Payer: Self-pay | Admitting: *Deleted

## 2022-04-02 NOTE — Patient Outreach (Signed)
  Care Coordination   Initial Visit Note   04/02/2022 Name: KAISYN REINHOLD MRN: [id number] DOB: [birth year]  KAYLEEANN HUXFORD is a 76 y.o. year old female who sees Vyas, Costella Hatcher, MD for primary care. I spoke with  Debbora Dus by phone today  What matters to the patients health and wellness today?  Seeing a dentist.  Already has a top denture plate, in need of a bottom one.  Monitors blood pressure and HR on a regular basis, both are stable.  Does have concern about neuropathy, will discuss with PCP during AWV scheduled for next month.     Goals Addressed               This Visit's Progress     Obtain dentist appointment (pt-stated)        Care Coordination Interventions: Advised patient to contact insurance company and get list of dentists in network Reviewed scheduled/upcoming provider appointments including need to keep AWV appointment Discussed plans with patient for ongoing care management follow up and provided patient with direct contact information for care management team Assessed social determinant of health barriers         SDOH assessments and interventions completed:  Yes  SDOH Interventions Today    Flowsheet Row Most Recent Value  SDOH Interventions   Food Insecurity Interventions Intervention Not Indicated  Financial Strain Interventions Intervention Not Indicated  Housing Interventions Intervention Not Indicated  Transportation Interventions Intervention Not Indicated        Care Coordination Interventions Activated:  Yes  Care Coordination Interventions:  Yes, provided   Follow up plan: No further intervention required.   Encounter Outcome:  Pt. Visit Completed   Valente David, RN, MSN, [facility] Care Coordinator [phone]

## 2022-04-02 NOTE — Patient Outreach (Signed)
  Care Coordination   04/02/2022 Name: Sara Solomon MRN: [id number] DOB: June 02, 1946   Care Coordination Outreach Attempts:  A second unsuccessful outreach was attempted today to offer the patient with information about available care coordination services as a benefit of their health plan.     Follow Up Plan:  Additional outreach attempts will be made to offer the patient care coordination information and services.   Encounter Outcome:  No Answer  Care Coordination Interventions Activated:  No   Care Coordination Interventions:  No, not indicated    Valente David, RN, MSN, [facility] Care Coordinator [phone]

## 2022-10-25 ENCOUNTER — Ambulatory Visit (INDEPENDENT_AMBULATORY_CARE_PROVIDER_SITE_OTHER): Payer: Medicare Other | Admitting: Obstetrics & Gynecology

## 2022-10-25 ENCOUNTER — Encounter: Payer: Self-pay | Admitting: Obstetrics & Gynecology

## 2022-10-25 VITALS — BP 176/87 | HR 92 | Ht 65.0 in | Wt 178.0 lb

## 2022-10-25 DIAGNOSIS — N993 Prolapse of vaginal vault after hysterectomy: Secondary | ICD-10-CM | POA: Diagnosis not present

## 2022-10-25 NOTE — Progress Notes (Addendum)
Chief Complaint  Patient presents with   Vaginal Prolapse    Blood pressure (!) 146/80, pulse 86, height '5\' 5"'$  (1.651 m), weight 178 lb (80.7 kg).  Sara Solomon presents today for evaluation of ?bladder prolapse She states it has been an issue for 10 years or so But has gotten worse Was fit for a pessary in [place] years ago, did not work  On exam, she has complete vaginal prolpase, had a hysterectomy 35 years ago or so She reports no vaginal discharge and no vaginal bleeding   Likert scale(1 not bothersome -5 very bothersome)  :  0  Exam reveals no undue vaginal mucosal pressure of breakdown, no discharge and no vaginal bleeding.  Vaginal Epithelial Abnormality Classification System:   0 0    No abnormalities 1    Epithelial erythema 2    Granulation tissue 3    Epithelial break or erosion, 1 cm or less 4    Epithelial break or erosion, 1 cm or greater  The pessary is fit: Milex ring with support #4(slight bow fit) may require Gelhorn Is not interested in surgery    ICD-10-CM   1. 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  Florian Buff, MD  10/25/2022 11:18 AM  Pt states she was picking up a rug in [place] and felt it slip down so she pushed it back up She immediately felt better with it placed this am and voided without her usual difficulty  Removed the Milex ring with support #4 and placed Gelhorn 2 1/2 inch with snug fit  Keep her scheduled appt  Florian Buff, MD 10/25/2022 2:46 PM

## 2022-12-04 ENCOUNTER — Ambulatory Visit (INDEPENDENT_AMBULATORY_CARE_PROVIDER_SITE_OTHER): Payer: Medicare Other | Admitting: Obstetrics & Gynecology

## 2022-12-04 ENCOUNTER — Encounter: Payer: Self-pay | Admitting: Obstetrics & Gynecology

## 2022-12-04 VITALS — BP 160/80 | HR 80 | Ht 66.0 in | Wt 178.0 lb

## 2022-12-04 DIAGNOSIS — N993 Prolapse of vaginal vault after hysterectomy: Secondary | ICD-10-CM

## 2022-12-04 NOTE — Progress Notes (Signed)
Chief Complaint  Patient presents with   Pessary Check    Blood pressure (!) 160/80, pulse 80, height  (1.676 m), weight 178 lb (80.7 kg).  Sara Solomon presents today for routine follow up related to her pessary.   She uses a Gelhorn 2 1/2 inch She reports no vaginal discharge and no vaginal bleeding   Likert scale(1 not bothersome -5 very bothersome)  :  1  Exam reveals no undue vaginal mucosal pressure of breakdown, no discharge and no vaginal bleeding.  Vaginal Epithelial Abnormality Classification System:   0 0    No abnormalities 1    Epithelial erythema 2    Granulation tissue 3    Epithelial break or erosion, 1 cm or less 4    Epithelial break or erosion, 1 cm or greater  The pessary is removed, cleaned and replaced without difficulty.      ICD-10-CM   1. Vaginal vault prolapse after hysterectomy, complete: Gelhorn 2 1/2 inch, placed 10/25/2022  N99.3    doing well without complaints, no bleeding or discharge       Sara Solomon will be sen back in 3 months for continued follow up.  Lazaro Arms, MD  12/04/2022 10:25 AM

## 2023-01-01 LAB — COLOGUARD

## 2023-01-01 LAB — EXTERNAL GENERIC LAB PROCEDURE

## 2023-03-01 ENCOUNTER — Encounter: Payer: Self-pay | Admitting: Obstetrics & Gynecology

## 2023-03-01 ENCOUNTER — Ambulatory Visit: Payer: Medicare HMO | Admitting: Obstetrics & Gynecology

## 2023-03-01 VITALS — BP 142/79 | HR 82

## 2023-03-01 DIAGNOSIS — N993 Prolapse of vaginal vault after hysterectomy: Secondary | ICD-10-CM

## 2023-03-01 NOTE — Progress Notes (Signed)
Chief Complaint  Patient presents with   Pessary Check    Blood pressure (!) 142/79, pulse 82.  Nicola Police presents today for routine follow up related to her pessary.   She uses a Gelhorn 2 1/2 inch, placed 3/24 She reports no vaginal discharge and no vaginal bleeding   Likert scale(1 not bothersome -5 very bothersome)  :  1  Exam reveals no undue vaginal mucosal pressure of breakdown, no discharge and no vaginal bleeding.  Vaginal Epithelial Abnormality Classification System:   0 0    No abnormalities 1    Epithelial erythema 2    Granulation tissue 3    Epithelial break or erosion, 1 cm or less 4    Epithelial break or erosion, 1 cm or greater  The pessary is removed, cleaned and replaced without difficulty.      ICD-10-CM   1. Vaginal vault prolapse after hysterectomy, complete: Gelhorn 2 1/2 inch, placed 10/25/2022  N99.3        Nicola Police will be sen back in 4 months for continued follow up.  Lazaro Arms, MD  03/01/2023 10:03 AM

## 2023-06-03 ENCOUNTER — Ambulatory Visit: Payer: Medicare HMO | Admitting: Neurology

## 2023-07-03 ENCOUNTER — Ambulatory Visit: Payer: Medicare HMO | Admitting: Obstetrics & Gynecology

## 2023-07-03 ENCOUNTER — Encounter: Payer: Self-pay | Admitting: Obstetrics & Gynecology

## 2023-07-03 VITALS — BP 134/79 | HR 67 | Ht 66.0 in | Wt 168.0 lb

## 2023-07-03 DIAGNOSIS — Z4689 Encounter for fitting and adjustment of other specified devices: Secondary | ICD-10-CM

## 2023-07-03 DIAGNOSIS — N993 Prolapse of vaginal vault after hysterectomy: Secondary | ICD-10-CM | POA: Diagnosis not present

## 2023-07-03 NOTE — Progress Notes (Signed)
Chief Complaint  Patient presents with   Pessary Check    Blood pressure 134/79, pulse 67, height 5\' 6"  (1.676 m), weight 168 lb (76.2 kg).  Sara Solomon presents today for routine follow up related to her pessary.   She uses a Education officer, community 1/2 inch She reports no vaginal discharge and no vaginal bleeding   Likert scale(1 not bothersome -5 very bothersome)  :  0  Exam reveals no undue vaginal mucosal pressure of breakdown, no discharge and no vaginal bleeding.  Vaginal Epithelial Abnormality Classification System:   0 0    No abnormalities 1    Epithelial erythema 2    Granulation tissue 3    Epithelial break or erosion, 1 cm or less 4    Epithelial break or erosion, 1 cm or greater  The pessary is removed, cleaned and replaced without difficulty.      ICD-10-CM   1. Pessary maintenance: Gelhorn 2 1/2 inch, placed 10/25/2022  Z46.89     2. Vaginal vault prolapse after hysterectomy, complete: Gelhorn 2 1/2 inch, placed 10/25/2022  N99.3        Sara Solomon will be sen back in 4 months for continued follow up.  Lazaro Arms, MD  07/03/2023 10:52 AM

## 2023-07-17 ENCOUNTER — Telehealth: Payer: Self-pay

## 2023-07-17 NOTE — Telephone Encounter (Signed)
Patient called and stated that Dr. Despina Hidden told her to use a suppository but, she can not remember the name of them.  Could you please call her and let her know the name of them. She said that you could leave a message if she does not answer.

## 2023-07-18 NOTE — Telephone Encounter (Signed)
Pt informed to get Boric Acid per Dr. Despina Hidden.

## 2023-09-09 DIAGNOSIS — M5416 Radiculopathy, lumbar region: Secondary | ICD-10-CM | POA: Diagnosis not present

## 2023-09-10 DIAGNOSIS — Z170 Estrogen receptor positive status [ER+]: Secondary | ICD-10-CM | POA: Diagnosis not present

## 2023-09-10 DIAGNOSIS — C50411 Malignant neoplasm of upper-outer quadrant of right female breast: Secondary | ICD-10-CM | POA: Diagnosis not present

## 2023-10-29 DIAGNOSIS — M5416 Radiculopathy, lumbar region: Secondary | ICD-10-CM | POA: Diagnosis not present

## 2023-10-31 ENCOUNTER — Encounter: Payer: Self-pay | Admitting: Obstetrics & Gynecology

## 2023-10-31 ENCOUNTER — Ambulatory Visit: Payer: Medicare HMO | Admitting: Obstetrics & Gynecology

## 2023-10-31 VITALS — BP 168/88 | HR 84 | Ht 66.0 in | Wt 170.0 lb

## 2023-10-31 DIAGNOSIS — Z4689 Encounter for fitting and adjustment of other specified devices: Secondary | ICD-10-CM | POA: Diagnosis not present

## 2023-10-31 DIAGNOSIS — N993 Prolapse of vaginal vault after hysterectomy: Secondary | ICD-10-CM | POA: Diagnosis not present

## 2023-10-31 NOTE — Progress Notes (Signed)
 Chief Complaint  Patient presents with   Pessary Check    Blood pressure (!) 168/88, pulse 84, height 5\' 6"  (1.676 m), weight 170 lb (77.1 kg).  Sara Solomon presents today for routine follow up related to her pessary.   She uses a Gelhorn 2 1/2 inch She reports no vaginal discharge and no vaginal bleeding   Likert scale(1 not bothersome -5 very bothersome)  :  1  Exam reveals no undue vaginal mucosal pressure of breakdown, no discharge and no vaginal bleeding.  Vaginal Epithelial Abnormality Classification System:   0 0    No abnormalities 1    Epithelial erythema 2    Granulation tissue 3    Epithelial break or erosion, 1 cm or less 4    Epithelial break or erosion, 1 cm or greater  The pessary is removed, cleaned and replaced without difficulty.      ICD-10-CM   1. Pessary maintenance: Gelhorn 2 1/2 inch, placed 10/25/2022  Z46.89     2. Vaginal vault prolapse after hysterectomy, complete: Gelhorn 2 1/2 inch, placed 10/25/2022  N99.3        Sara Solomon will be sen back in 4 months for continued follow up.  Lazaro Arms, MD  10/31/2023 10:13 AM

## 2024-03-02 ENCOUNTER — Ambulatory Visit: Admitting: Obstetrics & Gynecology

## 2024-03-02 ENCOUNTER — Encounter: Payer: Self-pay | Admitting: Obstetrics & Gynecology

## 2024-03-02 VITALS — BP 150/77 | HR 73 | Ht 65.0 in | Wt 166.5 lb

## 2024-03-02 DIAGNOSIS — N993 Prolapse of vaginal vault after hysterectomy: Secondary | ICD-10-CM

## 2024-03-02 DIAGNOSIS — Z4689 Encounter for fitting and adjustment of other specified devices: Secondary | ICD-10-CM

## 2024-03-02 NOTE — Progress Notes (Signed)
 Chief Complaint  Patient presents with   Pessary Check    Blood pressure (!) 155/72, pulse 73, height 5' 5 (1.651 m), weight 166 lb 8 oz (75.5 kg).  Sara Solomon presents today for routine follow up related to her pessary.   She uses a Gelhorn 2 1/2 inch She reports no vaginal discharge and no vaginal bleeding   Likert scale(1 not bothersome -5 very bothersome)  :  1  Exam reveals no undue vaginal mucosal pressure of breakdown, no discharge and no vaginal bleeding.  Vaginal Epithelial Abnormality Classification System:   0 0    No abnormalities 1    Epithelial erythema 2    Granulation tissue 3    Epithelial break or erosion, 1 cm or less 4    Epithelial break or erosion, 1 cm or greater  The pessary is removed, cleaned and replaced without difficulty.      ICD-10-CM   1. Pessary maintenance: Gelhorn 2 1/2 inch, placed 10/25/2022  Z46.89     2. Vaginal vault prolapse after hysterectomy, complete: Gelhorn 2 1/2 inch, placed 10/25/2022  N99.3        Sara Solomon will be sen back in 4 months for continued follow up.  Vonn VEAR Inch, MD  03/02/2024 8:59 AM

## 2024-04-06 ENCOUNTER — Ambulatory Visit: Attending: Internal Medicine | Admitting: Internal Medicine

## 2024-04-06 ENCOUNTER — Encounter: Payer: Self-pay | Admitting: Internal Medicine

## 2024-04-06 VITALS — BP 148/70 | HR 66 | Ht 65.0 in | Wt 164.8 lb

## 2024-04-06 DIAGNOSIS — I10 Essential (primary) hypertension: Secondary | ICD-10-CM

## 2024-04-06 DIAGNOSIS — R002 Palpitations: Secondary | ICD-10-CM | POA: Insufficient documentation

## 2024-04-06 NOTE — Patient Instructions (Addendum)
 Medication Instructions:  Your physician recommends that you continue on your current medications as directed. Please refer to the Current Medication list given to you today.   Labwork: None  Testing/Procedures: None  Follow-Up: Your physician recommends that you schedule a follow-up appointment in: Follow up as needed  Any Other Special Instructions Will Be Listed Below (If Applicable).  [employer]: Website: [email or web address]  DR. MALLIPEDDI RECOMMENDS YOU PURCHASE   [employer] FROM THE  [employer]  APP PLAY STORE.  THE APP IS FREE , BUT THE  EQUIPMENT HAS A COST. IT ALLOWS YOU TO OBTAIN A RECORDING OF YOUR HEART RATE AND RHYTHM BY PROVIDING A SHORT STRIP THAT YOU CAN SHARE WITH YOUR PROVIDER.    If you need a refill on your cardiac medications before your next appointment, please call your pharmacy.

## 2024-04-06 NOTE — Progress Notes (Signed)
 Cardiology Office Note  Date: 04/06/2024   ID: Sara, Solomon 1945/11/14, MRN [id number]  PCP:  Lori Thedora BROCKS  Cardiologist:  Diannah SHAUNNA Maywood, MD Electrophysiologist:  None   History of Present Illness: Sara Solomon is a 78 y.o. female  Referred to cardiology clinic for evaluation of palpitations.  Patient performs household chores and yard work and has no symptoms of angina or DOE.  Does not have palpitations at this time.  She reports having palpitations once in a couple of months in the daytime and 1-2 times per month in the nighttime.  Does not last long, seconds.  She cut back on coffee/tea significantly.  She checks her blood pressures at home, her BP machine alerted her that he she has irregular heart beat.  She underwent EKG at her PCPs office and was told emptying was normal.  She also underwent echocardiogram, reports not available to review.   Past Medical History:  Diagnosis Date   Anemia    Anxiety    Arthritis    Asthma    Chronic kidney disease (CKD), stage III (moderate) (HCC)    Hypertension    Hypothyroidism    Thyroid disease     Past Surgical History:  Procedure Laterality Date   ABDOMINAL HYSTERECTOMY     CATARACT EXTRACTION, BILATERAL     CHOLECYSTECTOMY     MASTECTOMY, PARTIAL Right 05/01/2017   Procedure: MASTECTOMY PARTIAL;  Surgeon: Mavis Anes, MD;  Location: [facility];  Service: General;  Laterality: Right;   PARTIAL MASTECTOMY WITH NEEDLE LOCALIZATION AND AXILLARY SENTINEL LYMPH NODE BX Right 04/03/2017   Procedure: PARTIAL MASTECTOMY WITH NEEDLE LOCALIZATION AND AXILLARY SENTINEL LYMPH NODE BX;  Surgeon: Mavis Anes, MD;  Location: [facility];  Service: General;  Laterality: Right;    Current Outpatient Medications  Medication Sig Dispense Refill   CALCIUM PO Take by mouth. Gummie chew occasionally     cholecalciferol (VITAMIN D3) 25 MCG (1000 UNIT) tablet Take 1,000 Units by mouth daily.     Cyanocobalamin (VITAMIN B-12 PO)  Take by mouth.     ferrous sulfate 325 (65 FE) MG EC tablet Take 325 mg by mouth daily.     ibuprofen (ADVIL) 200 MG tablet Take 400 mg by mouth every 6 (six) hours as needed.     levothyroxine (SYNTHROID) 88 MCG tablet Take 88 mcg by mouth daily before breakfast.     lisinopril-hydrochlorothiazide (ZESTORETIC) 20-25 MG tablet Take 1 tablet by mouth daily.     Multiple Vitamins-Minerals (WOMENS MULTI VITAMIN & MINERAL) TABS Take 1 tablet by mouth every evening.     omeprazole  (PRILOSEC) 40 MG capsule Take 1 capsule (40 mg total) by mouth daily. 90 capsule 3   No current facility-administered medications for this visit.   Allergies:  Penicillins   Social History: The patient  reports that she has quit smoking. She has never used smokeless tobacco. She reports that she does not drink alcohol and does not use drugs.   Family History: The patient's family history is not on file.   ROS:  Please see the history of present illness. Otherwise, complete review of systems is positive for none  All other systems are reviewed and negative.   Physical Exam: VS:  BP (!) 148/70   Pulse 66   Ht 5' 5 (1.651 m)   Wt 164 lb 12.8 oz (74.8 kg)   SpO2 98%   BMI 27.42 kg/m , BMI Body mass index is 27.42 kg/m.  Wt Readings from Last 3 Encounters:  04/06/24 164 lb 12.8 oz (74.8 kg)  03/02/24 166 lb 8 oz (75.5 kg)  10/31/23 170 lb (77.1 kg)    General: Patient appears comfortable at rest. HEENT: Conjunctiva and lids normal, oropharynx clear with moist mucosa. Neck: Supple, no elevated JVP or carotid bruits, no thyromegaly. Lungs: Clear to auscultation, nonlabored breathing at rest. Cardiac: Irregular rate and rhythm, no S3 or significant systolic murmur, no pericardial rub. Abdomen: Soft, nontender, no hepatomegaly, bowel sounds present, no guarding or rebound. Extremities: No pitting edema, distal pulses 2+. Skin: Warm and dry. Musculoskeletal: No kyphosis. Neuropsychiatric: Alert and oriented x3,  affect grossly appropriate.  Recent Labwork: No results found for requested labs within last 365 days.  No results found for: CHOL, TRIG, HDL, CHOLHDL, VLDL, LDLCALC, LDLDIRECT   Assessment and Plan:  Palpitations - Palpitations occur once in a couple of months in the daytime and 1-2 times/month at night.  Feels like her heart is skipping a beat.  EKG and echo were performed at her PCPs office, she was told it was normal, will request records. - Cut back on caffeinated products significantly. - Auscultation revealed irregular rate and rhythm.  EKG today showed NSR with rare/occasional PAC, PVCs. Due to infrequent nature of palpitations, will defer event monitor. - Discussed to purchase Kardia mobile or smart watch with EKG lead functionality to monitor the rhythm.  HTN, controlled - Patient has an element of whitecoat HTN.  Home blood pressure range around 130 mmHg SBP. - Continue lisinopril-HCTZ 20-25 mg once daily.  I spent 30 minutes reviewing prior records, more than 3 labs, discussion of palpitations with the patient.     Medication Adjustments/Labs and Tests Ordered: Current medicines are reviewed at length with the patient today.  Concerns regarding medicines are outlined above.    Disposition:  Follow up prn  Signed Shirrell Solinger Arleta Maywood, MD, 04/06/2024 1:51 PM    [facility] [address] [place], [place], [place] [address]

## 2024-04-08 ENCOUNTER — Encounter: Payer: Self-pay | Admitting: Internal Medicine

## 2024-06-22 ENCOUNTER — Ambulatory Visit: Admitting: Obstetrics & Gynecology

## 2024-06-26 ENCOUNTER — Encounter: Payer: Self-pay | Admitting: Obstetrics & Gynecology

## 2024-06-26 ENCOUNTER — Ambulatory Visit: Admitting: Obstetrics & Gynecology

## 2024-06-26 VITALS — BP 163/96 | Ht 64.0 in | Wt 165.0 lb

## 2024-06-26 DIAGNOSIS — N993 Prolapse of vaginal vault after hysterectomy: Secondary | ICD-10-CM

## 2024-06-26 DIAGNOSIS — Z4689 Encounter for fitting and adjustment of other specified devices: Secondary | ICD-10-CM | POA: Diagnosis not present

## 2024-06-26 NOTE — Progress Notes (Signed)
 Chief Complaint  Patient presents with   Pessary Check    Blood pressure (!) 163/96, height 5' 4 (1.626 m), weight 165 lb (74.8 kg).  Sara Solomon presents today for routine follow up related to her pessary.   She uses a Gelhorn 2 1/2 inch She reports no vaginal discharge and no vaginal bleeding   Likert scale(1 not bothersome -5 very bothersome)  :  1  Exam reveals no undue vaginal mucosal pressure of breakdown, no discharge and no vaginal bleeding.  Vaginal Epithelial Abnormality Classification System:   0 0    No abnormalities 1    Epithelial erythema 2    Granulation tissue 3    Epithelial break or erosion, 1 cm or less 4    Epithelial break or erosion, 1 cm or greater  The pessary is removed, cleaned and replaced without difficulty.      ICD-10-CM   1. Pessary maintenance: Gelhorn 2 1/2 inch, placed 10/25/2022  Z46.89     2. Vaginal vault prolapse after hysterectomy, complete: Gelhorn 2 1/2 inch, placed 10/25/2022  N99.3        Sara Solomon will be sen back in 4 months for continued follow up.  Vonn VEAR Inch, MD  06/26/2024 10:27 AM
